# Patient Record
Sex: Female | Born: 1940 | Race: White | Hispanic: No | Marital: Married | Smoking: Former smoker
Health system: Southern US, Community
[De-identification: ages and names within clinical notes are randomized; demographics above are authoritative.]

## PROBLEM LIST (undated history)

## (undated) DIAGNOSIS — M8580 Other specified disorders of bone density and structure, unspecified site: Secondary | ICD-10-CM

## (undated) DIAGNOSIS — I10 Essential (primary) hypertension: Secondary | ICD-10-CM

## (undated) DIAGNOSIS — N952 Postmenopausal atrophic vaginitis: Secondary | ICD-10-CM

## (undated) DIAGNOSIS — E7800 Pure hypercholesterolemia, unspecified: Secondary | ICD-10-CM

## (undated) DIAGNOSIS — D219 Benign neoplasm of connective and other soft tissue, unspecified: Secondary | ICD-10-CM

## (undated) HISTORY — DX: Pure hypercholesterolemia, unspecified: E78.00

## (undated) HISTORY — DX: Benign neoplasm of connective and other soft tissue, unspecified: D21.9

## (undated) HISTORY — DX: Essential (primary) hypertension: I10

## (undated) HISTORY — PX: HYSTEROSCOPY: SHX211

## (undated) HISTORY — DX: Postmenopausal atrophic vaginitis: N95.2

## (undated) HISTORY — DX: Other specified disorders of bone density and structure, unspecified site: M85.80

## (undated) HISTORY — PX: TUBAL LIGATION: SHX77

---

## 1999-10-06 ENCOUNTER — Encounter: Admission: RE | Admit: 1999-10-06 | Discharge: 1999-10-06 | Payer: Self-pay | Admitting: Obstetrics and Gynecology

## 1999-10-06 ENCOUNTER — Encounter: Payer: Self-pay | Admitting: Obstetrics and Gynecology

## 1999-11-04 ENCOUNTER — Other Ambulatory Visit: Admission: RE | Admit: 1999-11-04 | Discharge: 1999-11-04 | Payer: Self-pay | Admitting: Obstetrics and Gynecology

## 1999-11-30 ENCOUNTER — Ambulatory Visit (HOSPITAL_COMMUNITY): Admission: RE | Admit: 1999-11-30 | Discharge: 1999-11-30 | Payer: Self-pay | Admitting: Obstetrics and Gynecology

## 1999-11-30 ENCOUNTER — Encounter (INDEPENDENT_AMBULATORY_CARE_PROVIDER_SITE_OTHER): Payer: Self-pay

## 2000-07-21 ENCOUNTER — Other Ambulatory Visit: Admission: RE | Admit: 2000-07-21 | Discharge: 2000-07-21 | Payer: Self-pay | Admitting: Obstetrics and Gynecology

## 2000-07-21 ENCOUNTER — Encounter (INDEPENDENT_AMBULATORY_CARE_PROVIDER_SITE_OTHER): Payer: Self-pay

## 2000-10-06 ENCOUNTER — Encounter: Admission: RE | Admit: 2000-10-06 | Discharge: 2000-10-06 | Payer: Self-pay | Admitting: Obstetrics and Gynecology

## 2000-10-06 ENCOUNTER — Encounter: Payer: Self-pay | Admitting: Obstetrics and Gynecology

## 2000-10-28 ENCOUNTER — Encounter: Admission: RE | Admit: 2000-10-28 | Discharge: 2000-10-28 | Payer: Self-pay | Admitting: Family Medicine

## 2000-10-28 ENCOUNTER — Encounter: Payer: Self-pay | Admitting: Family Medicine

## 2000-11-08 ENCOUNTER — Other Ambulatory Visit: Admission: RE | Admit: 2000-11-08 | Discharge: 2000-11-08 | Payer: Self-pay | Admitting: Obstetrics and Gynecology

## 2001-10-23 ENCOUNTER — Encounter: Admission: RE | Admit: 2001-10-23 | Discharge: 2001-10-23 | Payer: Self-pay | Admitting: Obstetrics and Gynecology

## 2001-10-23 ENCOUNTER — Encounter: Payer: Self-pay | Admitting: Obstetrics and Gynecology

## 2001-11-08 ENCOUNTER — Other Ambulatory Visit: Admission: RE | Admit: 2001-11-08 | Discharge: 2001-11-08 | Payer: Self-pay | Admitting: Obstetrics and Gynecology

## 2002-11-02 ENCOUNTER — Encounter: Admission: RE | Admit: 2002-11-02 | Discharge: 2002-11-02 | Payer: Self-pay | Admitting: Family Medicine

## 2002-11-02 ENCOUNTER — Encounter: Payer: Self-pay | Admitting: Family Medicine

## 2002-11-26 ENCOUNTER — Other Ambulatory Visit: Admission: RE | Admit: 2002-11-26 | Discharge: 2002-11-26 | Payer: Self-pay | Admitting: Obstetrics and Gynecology

## 2003-04-04 ENCOUNTER — Ambulatory Visit (HOSPITAL_COMMUNITY): Admission: RE | Admit: 2003-04-04 | Discharge: 2003-04-04 | Payer: Self-pay | Admitting: Gastroenterology

## 2003-11-04 ENCOUNTER — Encounter: Admission: RE | Admit: 2003-11-04 | Discharge: 2003-11-04 | Payer: Self-pay | Admitting: Family Medicine

## 2003-11-28 ENCOUNTER — Other Ambulatory Visit: Admission: RE | Admit: 2003-11-28 | Discharge: 2003-11-28 | Payer: Self-pay | Admitting: Obstetrics and Gynecology

## 2004-11-06 ENCOUNTER — Encounter: Admission: RE | Admit: 2004-11-06 | Discharge: 2004-11-06 | Payer: Self-pay | Admitting: Family Medicine

## 2004-11-30 ENCOUNTER — Other Ambulatory Visit: Admission: RE | Admit: 2004-11-30 | Discharge: 2004-11-30 | Payer: Self-pay | Admitting: Obstetrics and Gynecology

## 2005-03-10 ENCOUNTER — Encounter: Admission: RE | Admit: 2005-03-10 | Discharge: 2005-03-10 | Payer: Self-pay | Admitting: Family Medicine

## 2005-11-10 ENCOUNTER — Encounter: Admission: RE | Admit: 2005-11-10 | Discharge: 2005-11-10 | Payer: Self-pay | Admitting: Family Medicine

## 2005-12-07 ENCOUNTER — Other Ambulatory Visit: Admission: RE | Admit: 2005-12-07 | Discharge: 2005-12-07 | Payer: Self-pay | Admitting: Obstetrics and Gynecology

## 2006-11-14 ENCOUNTER — Encounter: Admission: RE | Admit: 2006-11-14 | Discharge: 2006-11-14 | Payer: Self-pay | Admitting: Obstetrics and Gynecology

## 2006-12-09 ENCOUNTER — Other Ambulatory Visit: Admission: RE | Admit: 2006-12-09 | Discharge: 2006-12-09 | Payer: Self-pay | Admitting: Obstetrics and Gynecology

## 2007-03-27 ENCOUNTER — Encounter: Admission: RE | Admit: 2007-03-27 | Discharge: 2007-03-27 | Payer: Self-pay | Admitting: Internal Medicine

## 2007-11-17 ENCOUNTER — Encounter: Admission: RE | Admit: 2007-11-17 | Discharge: 2007-11-17 | Payer: Self-pay | Admitting: Obstetrics and Gynecology

## 2007-11-28 ENCOUNTER — Encounter: Admission: RE | Admit: 2007-11-28 | Discharge: 2007-11-28 | Payer: Self-pay | Admitting: Obstetrics and Gynecology

## 2007-12-12 ENCOUNTER — Other Ambulatory Visit: Admission: RE | Admit: 2007-12-12 | Discharge: 2007-12-12 | Payer: Self-pay | Admitting: Obstetrics and Gynecology

## 2008-05-27 ENCOUNTER — Encounter: Admission: RE | Admit: 2008-05-27 | Discharge: 2008-05-27 | Payer: Self-pay | Admitting: Obstetrics and Gynecology

## 2008-11-18 ENCOUNTER — Encounter: Admission: RE | Admit: 2008-11-18 | Discharge: 2008-11-18 | Payer: Self-pay | Admitting: Obstetrics and Gynecology

## 2008-12-12 ENCOUNTER — Ambulatory Visit: Payer: Self-pay | Admitting: Obstetrics and Gynecology

## 2008-12-18 ENCOUNTER — Ambulatory Visit: Payer: Self-pay | Admitting: Obstetrics and Gynecology

## 2009-07-04 ENCOUNTER — Ambulatory Visit (HOSPITAL_COMMUNITY): Admission: RE | Admit: 2009-07-04 | Discharge: 2009-07-04 | Payer: Self-pay | Admitting: Internal Medicine

## 2009-10-04 DIAGNOSIS — M8580 Other specified disorders of bone density and structure, unspecified site: Secondary | ICD-10-CM

## 2009-10-04 HISTORY — DX: Other specified disorders of bone density and structure, unspecified site: M85.80

## 2009-11-19 ENCOUNTER — Encounter: Admission: RE | Admit: 2009-11-19 | Discharge: 2009-11-19 | Payer: Self-pay | Admitting: Obstetrics and Gynecology

## 2009-12-16 ENCOUNTER — Ambulatory Visit: Payer: Self-pay | Admitting: Obstetrics and Gynecology

## 2010-10-24 ENCOUNTER — Other Ambulatory Visit: Payer: Self-pay | Admitting: Internal Medicine

## 2010-10-24 DIAGNOSIS — Z1239 Encounter for other screening for malignant neoplasm of breast: Secondary | ICD-10-CM

## 2010-10-25 ENCOUNTER — Encounter: Payer: Self-pay | Admitting: Family Medicine

## 2010-11-30 ENCOUNTER — Ambulatory Visit
Admission: RE | Admit: 2010-11-30 | Discharge: 2010-11-30 | Disposition: A | Discharge status: Home / Self Care | Payer: BC Managed Care – PPO | Source: Ambulatory Visit | Attending: Internal Medicine | Admitting: Internal Medicine

## 2010-11-30 DIAGNOSIS — Z1239 Encounter for other screening for malignant neoplasm of breast: Secondary | ICD-10-CM

## 2010-12-22 ENCOUNTER — Encounter (INDEPENDENT_AMBULATORY_CARE_PROVIDER_SITE_OTHER): Payer: BC Managed Care – PPO | Admitting: Obstetrics and Gynecology

## 2010-12-22 DIAGNOSIS — R82998 Other abnormal findings in urine: Secondary | ICD-10-CM

## 2010-12-22 DIAGNOSIS — Z01419 Encounter for gynecological examination (general) (routine) without abnormal findings: Secondary | ICD-10-CM

## 2011-02-19 NOTE — Op Note (Signed)
   [name], [name]                         ACCOUNT NO.:  [id number]   MEDICAL RECORD NO.:  [id number]                   PATIENT TYPE:  AMB   LOCATION:  ENDO                                 FACILITY:  [facility]   PHYSICIAN:  Petra Kuba, M.D.                 DATE OF BIRTH:  [birth year]   DATE OF PROCEDURE:  04/04/2003  DATE OF DISCHARGE:                                 OPERATIVE REPORT   PROCEDURE:  Colonoscopy.   INDICATIONS:  Screening.  Consent was signed after risks, benefits, methods,  and options thoroughly discussed in the office.   MEDICATIONS:  Demerol 50 mg, Versed 4 mg.   PROCEDURE:  Rectal inspection was pertinent for external hemorrhoids.  Digital exam was negative.  The Olympus pediatric adjustable colonoscope was  inserted and with moderate difficulty due to a very tortuous, long, looping  colon with various abdominal pressures and rolling her on her back, we were  able to advance to the cecum.  No abnormality was seen on insertion.  The  cecum was identified by the appendiceal orifice and the ileocecal valve; in  fact, the scope was inserted a short way into the terminal ileum, which was  normal.  Photo documentation was obtained.  Prep was adequate.  There was  some liquid stool that required washing and suctioning.  On slow withdrawal  through the colon, no abnormalities were seen, specifically no polyps, tumor  masses, diverticula, or other abnormalities.  When we fell back behind a  particular tortuous loop, we tried to readvance around the curves to  decrease chances of missing anything.  Once back in the rectum anorectal  pull-through and retroflexion confirmed some small hemorrhoids.  The scope  was straightened and readvanced a short way up the ft side of the colon, the  air was suctioned and scope removed.  The patient tolerated the procedure  well.  There was no obvious immediate complication.   ENDOSCOPIC DIAGNOSES:  1. Small internal-external  hemorrhoids.  2. Otherwise within normal limits to the terminal ileum except tortuosity.   PLAN:  Yearly rectals and guaiacs per Dr. Modesto Charon or gynecology.  Happy to see  back p.r.n., otherwise repeat screening in five to 10 years if doing well  medically.                                               Petra Kuba, M.D.    MEM/MEDQ  D:  04/04/2003  T:  04/04/2003  Job:  [id number]   cc:   Thelma Barge P. Modesto Charon, M.D.  [address]  [place]  [place] [address]  Fax: [phone]

## 2011-11-01 ENCOUNTER — Other Ambulatory Visit: Payer: Self-pay | Admitting: Internal Medicine

## 2011-11-01 DIAGNOSIS — Z1231 Encounter for screening mammogram for malignant neoplasm of breast: Secondary | ICD-10-CM

## 2011-12-02 ENCOUNTER — Ambulatory Visit
Admission: RE | Admit: 2011-12-02 | Discharge: 2011-12-02 | Disposition: A | Discharge status: Home / Self Care | Payer: BC Managed Care – PPO | Source: Ambulatory Visit | Attending: Internal Medicine | Admitting: Internal Medicine

## 2011-12-02 DIAGNOSIS — Z1231 Encounter for screening mammogram for malignant neoplasm of breast: Secondary | ICD-10-CM

## 2011-12-24 ENCOUNTER — Encounter: Payer: Self-pay | Admitting: Gynecology

## 2011-12-24 DIAGNOSIS — N952 Postmenopausal atrophic vaginitis: Secondary | ICD-10-CM | POA: Insufficient documentation

## 2011-12-24 DIAGNOSIS — I10 Essential (primary) hypertension: Secondary | ICD-10-CM | POA: Insufficient documentation

## 2011-12-24 DIAGNOSIS — D219 Benign neoplasm of connective and other soft tissue, unspecified: Secondary | ICD-10-CM | POA: Insufficient documentation

## 2011-12-24 DIAGNOSIS — M8580 Other specified disorders of bone density and structure, unspecified site: Secondary | ICD-10-CM | POA: Insufficient documentation

## 2011-12-24 DIAGNOSIS — E7800 Pure hypercholesterolemia, unspecified: Secondary | ICD-10-CM | POA: Insufficient documentation

## 2011-12-30 ENCOUNTER — Encounter: Payer: Self-pay | Admitting: Obstetrics and Gynecology

## 2011-12-30 ENCOUNTER — Ambulatory Visit: Payer: BC Managed Care – PPO | Admitting: Obstetrics and Gynecology

## 2011-12-30 VITALS — BP 140/86 | Ht 60.5 in | Wt 110.0 lb

## 2011-12-30 DIAGNOSIS — Z01419 Encounter for gynecological examination (general) (routine) without abnormal findings: Secondary | ICD-10-CM

## 2011-12-30 LAB — URINALYSIS W MICROSCOPIC + REFLEX CULTURE
Glucose, UA: NEGATIVE mg/dL
Hgb urine dipstick: NEGATIVE
Ketones, ur: NEGATIVE mg/dL
Leukocytes, UA: NEGATIVE
Specific Gravity, Urine: 1.005 (ref 1.005–1.030)
Squamous Epithelial / LPF: NONE SEEN
pH: 6.5 (ref 5.0–8.0)

## 2011-12-30 NOTE — Progress Notes (Signed)
Patient came to see me today for her annual GYN exam. She is having no vaginal bleeding. She is having no pelvic pain. She is not having any hot flashes. She does have vaginal dryness but is not currently sexually active due to her husband. She previously been on Actonel for osteopenia but has been on drug holiday. Her last bone density showed her worst T score to be -1.2. She did have some additional bone loss from  the previous one. She is up-to-date on mammograms. She has nocturia x1. She has no urgency, dysuria, urinary frequency, or incontinence.  Physical examination: Kennon Portela present HEENT within normal limits. Neck: Thyroid not large. No masses. Supraclavicular nodes: not enlarged. Breasts: Examined in both sitting and lying  position. No skin changes and no masses. Abdomen: Soft no guarding rebound or masses or hernia. Pelvic: External: Within normal limits. BUS: Within normal limits. Vaginal:within normal limits. Poor  estrogen effect. No evidence of cystocele rectocele or enterocele. Cervix: clean. Uterus: Normal size and shape. Adnexa: No masses. Rectovaginal exam: Confirmatory and negative. Extremities: Within normal limits.  Assessment: Atrophic vaginitis. Osteopenia. Nocturia.  Plan: Continue yearly mammograms. Bone density next year with mammogram. Lab through PCP.

## 2012-10-25 ENCOUNTER — Other Ambulatory Visit: Payer: Self-pay | Admitting: Internal Medicine

## 2012-10-25 DIAGNOSIS — Z1231 Encounter for screening mammogram for malignant neoplasm of breast: Secondary | ICD-10-CM

## 2012-11-18 ENCOUNTER — Ambulatory Visit: Payer: BC Managed Care – PPO

## 2012-11-18 ENCOUNTER — Ambulatory Visit (INDEPENDENT_AMBULATORY_CARE_PROVIDER_SITE_OTHER): Payer: BC Managed Care – PPO | Admitting: Internal Medicine

## 2012-11-18 VITALS — BP 147/78 | HR 67 | Temp 98.0°F | Resp 18 | Wt 106.0 lb

## 2012-11-18 DIAGNOSIS — R109 Unspecified abdominal pain: Secondary | ICD-10-CM

## 2012-11-18 DIAGNOSIS — R1032 Left lower quadrant pain: Secondary | ICD-10-CM

## 2012-11-18 LAB — POCT URINALYSIS DIPSTICK
Blood, UA: NEGATIVE
Glucose, UA: NEGATIVE
Ketones, UA: NEGATIVE
Protein, UA: NEGATIVE
pH, UA: 7

## 2012-11-18 LAB — POCT UA - MICROSCOPIC ONLY
Crystals, Ur, HPF, POC: NEGATIVE
Mucus, UA: NEGATIVE

## 2012-11-18 LAB — POCT CBC
Granulocyte percent: 65.4 %G (ref 37–80)
Hemoglobin: 15.3 g/dL (ref 12.2–16.2)
MCV: 100.4 fL — AB (ref 80–97)
MID (cbc): 0.4 (ref 0–0.9)
MPV: 8.4 fL (ref 0–99.8)
POC Granulocyte: 4.3 (ref 2–6.9)
POC LYMPH PERCENT: 29.2 %L (ref 10–50)
POC MID %: 5.4 %M (ref 0–12)
RBC: 4.59 M/uL (ref 4.04–5.48)
WBC: 6.5 10*3/uL (ref 4.6–10.2)

## 2012-11-18 LAB — COMPREHENSIVE METABOLIC PANEL
ALT: 13 U/L (ref 0–35)
AST: 21 U/L (ref 0–37)
Albumin: 4.8 g/dL (ref 3.5–5.2)
CO2: 28 mEq/L (ref 19–32)
Calcium: 9.9 mg/dL (ref 8.4–10.5)
Chloride: 97 mEq/L (ref 96–112)
Creat: 0.83 mg/dL (ref 0.50–1.10)
Potassium: 4.1 mEq/L (ref 3.5–5.3)
Sodium: 133 mEq/L — ABNORMAL LOW (ref 135–145)

## 2012-11-18 MED ORDER — KETOROLAC TROMETHAMINE 60 MG/2ML IM SOLN
60.0000 mg | Freq: Once | INTRAMUSCULAR | Status: AC
  Administered 2012-11-18: 60 mg via INTRAMUSCULAR

## 2012-11-18 NOTE — Progress Notes (Signed)
  Subjective:    Patient ID: [name], female    DOB: 09/03/1941, 72 y.o.   MRN: [id number]  HPI complaining of four-day history of pain in the left flank extending toward the left lower quadrant The pain is colicky in nature and comes and goes No associated constipation or diarrhea No dysuria frequency urgency or hematuria History of a similar episode 4-5 years ago that resolved with an IM injection/suspected stone No fever chills night sweats No nausea vomiting  Primary care Dr. Kevan Ny On medication for hypertension and hyperlipidemia    Review of Systems No recent activity changes or fatigue No fever chills or night sweats No recent weight loss No chest pain or palpitations No reflux or dyspepsia    Objective:   Physical Exam Blood pressure 147/78 No acute distress Heart regular without murmur Lungs clear Abdomen supple without organomegaly masses or tenderness Tender palpation in the left flank and the left midaxillary line close to the left lower quadrant Negative percussion tenderness/ no rebound tenderness     [facility] reading (PRIMARY) by  Dr. Josephina Gip stone seen Lots of stool LLQ,LUQ   Results for orders placed in visit on 11/18/12  POCT CBC      Result Value Range   WBC 6.5  4.6 - 10.2 K/uL   Lymph, poc 1.9  0.6 - 3.4   POC LYMPH PERCENT 29.2  10 - 50 %L   MID (cbc) 0.4  0 - 0.9   POC MID % 5.4  0 - 12 %M   POC Granulocyte 4.3  2 - 6.9   Granulocyte percent 65.4  37 - 80 %G   RBC 4.59  4.04 - 5.48 M/uL   Hemoglobin 15.3  12.2 - 16.2 g/dL   HCT, POC 18.8  41.6 - 47.9 %   MCV 100.4 (*) 80 - 97 fL   MCH, POC 33.3 (*) 27 - 31.2 pg   MCHC 33.2  31.8 - 35.4 g/dL   RDW, POC 60.6     Platelet Count, POC 290  142 - 424 K/uL   MPV 8.4  0 - 99.8 fL  POCT UA - MICROSCOPIC ONLY      Result Value Range   WBC, Ur, HPF, POC 1-5     RBC, urine, microscopic 0-1     Bacteria, U Microscopic trace     Mucus, UA neg     Epithelial cells, urine per micros 0-1      Crystals, Ur, HPF, POC neg     Casts, Ur, LPF, POC neg     Yeast, UA neg    POCT URINALYSIS DIPSTICK      Result Value Range   Color, UA yellow     Clarity, UA clear     Glucose, UA neg     Bilirubin, UA neg     Ketones, UA neg     Spec Grav, UA 1.010     Blood, UA neg     pH, UA 7.0     Protein, UA neg     Urobilinogen, UA 0.2     Nitrite, UA neg     Leukocytes, UA Trace      Assessment & Plan:  Problem #1 left flank pain/left lower quadrant pain  Nephrolithiasis versus constipation Toradol 60 a.m. Start MiraLax 17 g daily Recheck 48-72 hours with abdominal ultrasound if not improving Check metabolic profile

## 2012-12-12 ENCOUNTER — Ambulatory Visit
Admission: RE | Admit: 2012-12-12 | Discharge: 2012-12-12 | Disposition: A | Discharge status: Home / Self Care | Payer: BC Managed Care – PPO | Source: Ambulatory Visit | Attending: Internal Medicine | Admitting: Internal Medicine

## 2012-12-12 DIAGNOSIS — Z1231 Encounter for screening mammogram for malignant neoplasm of breast: Secondary | ICD-10-CM

## 2013-01-01 ENCOUNTER — Encounter: Payer: Self-pay | Admitting: Gynecology

## 2013-01-01 ENCOUNTER — Ambulatory Visit (INDEPENDENT_AMBULATORY_CARE_PROVIDER_SITE_OTHER): Payer: BC Managed Care – PPO | Admitting: Gynecology

## 2013-01-01 VITALS — BP 120/74 | Ht 60.0 in | Wt 103.0 lb

## 2013-01-01 DIAGNOSIS — M8580 Other specified disorders of bone density and structure, unspecified site: Secondary | ICD-10-CM

## 2013-01-01 DIAGNOSIS — N952 Postmenopausal atrophic vaginitis: Secondary | ICD-10-CM

## 2013-01-01 DIAGNOSIS — Z01419 Encounter for gynecological examination (general) (routine) without abnormal findings: Secondary | ICD-10-CM

## 2013-01-01 DIAGNOSIS — N9089 Other specified noninflammatory disorders of vulva and perineum: Secondary | ICD-10-CM

## 2013-01-01 DIAGNOSIS — M899 Disorder of bone, unspecified: Secondary | ICD-10-CM

## 2013-01-01 DIAGNOSIS — M949 Disorder of cartilage, unspecified: Secondary | ICD-10-CM

## 2013-01-01 NOTE — Patient Instructions (Signed)
Call me if you do any further bleeding. We will start with ultrasound. Otherwise followup in one year for annual exam. Schedule DEXA bone density when you do your next mammogram.

## 2013-01-01 NOTE — Progress Notes (Signed)
AMELIYA NICOTRA 09-09-1941 [id number]        72 y.o.  [id number] for followup exam.  Former patient of Dr. Eda Paschal Several issues noted below.  Past medical history,surgical history, medications, allergies, family history and social history were all reviewed and documented in the EPIC chart. ROS:  Was performed and pertinent positives and negatives are included in the history.  Exam: Kim assistant Filed Vitals:   01/01/13 1545  BP: 120/74  Height: 5' (1.524 m)  Weight: 103 lb (46.72 kg)   General appearance  Normal Skin grossly normal Head/Neck normal with no cervical or supraclavicular adenopathy thyroid normal Lungs  clear Cardiac RR, without RMG Abdominal  soft, nontender, without masses, organomegaly or hernia Breasts  examined lying and sitting without masses, retractions, discharge or axillary adenopathy. Pelvic  Ext/BUS/vagina  normal with atrophic changes  Cervix  normal with atrophic changes  Uterus  anteverted, small size, shape and contour, midline and mobile nontender   Adnexa  Without masses or tenderness    Anus and perineum  normal   Rectovaginal  normal sphincter tone without palpated masses or tenderness.    Assessment/Plan:  72 y.o. [id number] female for followup exam.   1. Episode of perineal bleeding. Patient was evaluated at urgent care secondary to left lower quadrant pain thought to be possibly a kidney stone in February. She subsequent had an episode of bleeding on her tissue paper where she followed up with her primary physician and ultimately had a colonoscopy which was normal. On questioning she was unclear whether it was truly rectal versus vaginal. I reviewed with her the issues that if indeed vaginal could represent uterine cancer, recognizing atrophic bleeding both vaginal/uterine more likely. Options for management reviewed to include expectant management as this was a single episode of unclear etiology versus starting with ultrasound now for endometrial  echo. The various possibilities to include thin endometrium, indeterminate measurement or thickened endometrium was reviewed and the possibilities for followup to include endometrial sampling. My preference would be to start with the ultrasound but the patient is reluctant and prefers just to observe. She agrees to report any subsequent bleeding for further evaluation and she clearly understands the various possibilities to include endometrial cancer. 2. Osteopenia. DEXA 11/2009 with T score -1.2 FRAX 7.6%/0.8%. Recommend repeat when she has her next mammogram. She had been on Actonel for several years previously but has been off of this for several years. She is due for her mammogram next year and I think it is fine to wait until then to repeat her bone density. Increase calcium and vitamin D reviewed 3. Mammography 12/2012 normal. Repeat next year. SBE monthly reviewed. 4. Pap smear 2009. No Pap smear done today. No history of abnormal Pap smears. Review current screening guidelines as she is over the age of 30 he does agree to stop screening. 5. Colonoscopy 2014. Repeat at 10 year interval per their recommendation. 6. Lab work. No lab work done as it is all done through her primary physician's office. Followup as noted above otherwise in one year.   Dara Lords MD, 4:17 PM 01/01/2013

## 2013-05-09 ENCOUNTER — Other Ambulatory Visit: Payer: Self-pay

## 2013-08-09 ENCOUNTER — Other Ambulatory Visit: Payer: Self-pay

## 2013-11-28 ENCOUNTER — Other Ambulatory Visit: Payer: Self-pay

## 2013-11-28 DIAGNOSIS — Z1231 Encounter for screening mammogram for malignant neoplasm of breast: Secondary | ICD-10-CM

## 2013-12-17 ENCOUNTER — Ambulatory Visit
Admission: RE | Admit: 2013-12-17 | Discharge: 2013-12-17 | Disposition: A | Discharge status: Home / Self Care | Payer: BC Managed Care – PPO | Source: Ambulatory Visit

## 2013-12-17 DIAGNOSIS — Z1231 Encounter for screening mammogram for malignant neoplasm of breast: Secondary | ICD-10-CM

## 2014-03-15 ENCOUNTER — Other Ambulatory Visit: Payer: Self-pay | Admitting: Obstetrics and Gynecology

## 2014-03-15 DIAGNOSIS — M8580 Other specified disorders of bone density and structure, unspecified site: Secondary | ICD-10-CM

## 2014-04-16 ENCOUNTER — Other Ambulatory Visit: Payer: BC Managed Care – PPO

## 2014-04-23 ENCOUNTER — Ambulatory Visit
Admission: RE | Admit: 2014-04-23 | Discharge: 2014-04-23 | Disposition: A | Discharge status: Home / Self Care | Payer: BC Managed Care – PPO | Source: Ambulatory Visit | Attending: Obstetrics and Gynecology | Admitting: Obstetrics and Gynecology

## 2014-04-23 DIAGNOSIS — M8580 Other specified disorders of bone density and structure, unspecified site: Secondary | ICD-10-CM

## 2014-08-05 ENCOUNTER — Encounter: Payer: Self-pay | Admitting: Gynecology

## 2014-11-21 ENCOUNTER — Other Ambulatory Visit: Payer: Self-pay

## 2014-11-21 DIAGNOSIS — Z1231 Encounter for screening mammogram for malignant neoplasm of breast: Secondary | ICD-10-CM

## 2014-12-20 ENCOUNTER — Ambulatory Visit
Admission: RE | Admit: 2014-12-20 | Discharge: 2014-12-20 | Disposition: A | Discharge status: Home / Self Care | Payer: BLUE CROSS/BLUE SHIELD | Source: Ambulatory Visit

## 2014-12-20 DIAGNOSIS — Z1231 Encounter for screening mammogram for malignant neoplasm of breast: Secondary | ICD-10-CM

## 2015-11-13 ENCOUNTER — Other Ambulatory Visit: Payer: Self-pay

## 2015-11-13 DIAGNOSIS — Z1231 Encounter for screening mammogram for malignant neoplasm of breast: Secondary | ICD-10-CM

## 2015-12-22 ENCOUNTER — Ambulatory Visit
Admission: RE | Admit: 2015-12-22 | Discharge: 2015-12-22 | Disposition: A | Discharge status: Home / Self Care | Payer: BLUE CROSS/BLUE SHIELD | Source: Ambulatory Visit

## 2015-12-22 DIAGNOSIS — Z1231 Encounter for screening mammogram for malignant neoplasm of breast: Secondary | ICD-10-CM

## 2016-11-17 ENCOUNTER — Other Ambulatory Visit: Payer: Self-pay | Admitting: Obstetrics and Gynecology

## 2016-11-17 DIAGNOSIS — Z1231 Encounter for screening mammogram for malignant neoplasm of breast: Secondary | ICD-10-CM

## 2016-12-27 ENCOUNTER — Ambulatory Visit
Admission: RE | Admit: 2016-12-27 | Discharge: 2016-12-27 | Disposition: A | Discharge status: Home / Self Care | Payer: BLUE CROSS/BLUE SHIELD | Source: Ambulatory Visit | Attending: Obstetrics and Gynecology | Admitting: Obstetrics and Gynecology

## 2016-12-27 DIAGNOSIS — Z1231 Encounter for screening mammogram for malignant neoplasm of breast: Secondary | ICD-10-CM

## 2017-11-17 ENCOUNTER — Other Ambulatory Visit: Payer: Self-pay | Admitting: Obstetrics and Gynecology

## 2017-11-17 DIAGNOSIS — Z1231 Encounter for screening mammogram for malignant neoplasm of breast: Secondary | ICD-10-CM

## 2018-01-02 ENCOUNTER — Ambulatory Visit: Payer: BLUE CROSS/BLUE SHIELD

## 2018-01-09 ENCOUNTER — Ambulatory Visit
Admission: RE | Admit: 2018-01-09 | Discharge: 2018-01-09 | Disposition: A | Discharge status: Home / Self Care | Payer: BLUE CROSS/BLUE SHIELD | Source: Ambulatory Visit | Attending: Obstetrics and Gynecology | Admitting: Obstetrics and Gynecology

## 2018-01-09 DIAGNOSIS — Z1231 Encounter for screening mammogram for malignant neoplasm of breast: Secondary | ICD-10-CM

## 2018-10-03 DIAGNOSIS — E559 Vitamin D deficiency, unspecified: Secondary | ICD-10-CM | POA: Diagnosis not present

## 2018-10-03 DIAGNOSIS — R946 Abnormal results of thyroid function studies: Secondary | ICD-10-CM | POA: Diagnosis not present

## 2018-10-03 DIAGNOSIS — Z0000 Encounter for general adult medical examination without abnormal findings: Secondary | ICD-10-CM | POA: Diagnosis not present

## 2018-10-03 DIAGNOSIS — Z79899 Other long term (current) drug therapy: Secondary | ICD-10-CM | POA: Diagnosis not present

## 2018-10-03 DIAGNOSIS — I10 Essential (primary) hypertension: Secondary | ICD-10-CM | POA: Diagnosis not present

## 2018-10-03 DIAGNOSIS — E871 Hypo-osmolality and hyponatremia: Secondary | ICD-10-CM | POA: Diagnosis not present

## 2018-10-03 DIAGNOSIS — E878 Other disorders of electrolyte and fluid balance, not elsewhere classified: Secondary | ICD-10-CM | POA: Diagnosis not present

## 2018-10-03 DIAGNOSIS — B351 Tinea unguium: Secondary | ICD-10-CM | POA: Diagnosis not present

## 2018-10-03 DIAGNOSIS — R0989 Other specified symptoms and signs involving the circulatory and respiratory systems: Secondary | ICD-10-CM | POA: Diagnosis not present

## 2018-10-03 DIAGNOSIS — E782 Mixed hyperlipidemia: Secondary | ICD-10-CM | POA: Diagnosis not present

## 2018-10-16 DIAGNOSIS — R9431 Abnormal electrocardiogram [ECG] [EKG]: Secondary | ICD-10-CM | POA: Diagnosis not present

## 2018-10-16 DIAGNOSIS — R0989 Other specified symptoms and signs involving the circulatory and respiratory systems: Secondary | ICD-10-CM | POA: Diagnosis not present

## 2018-10-27 DIAGNOSIS — R0989 Other specified symptoms and signs involving the circulatory and respiratory systems: Secondary | ICD-10-CM | POA: Diagnosis not present

## 2018-10-27 DIAGNOSIS — E782 Mixed hyperlipidemia: Secondary | ICD-10-CM | POA: Diagnosis not present

## 2019-03-07 ENCOUNTER — Other Ambulatory Visit: Payer: Self-pay | Admitting: Internal Medicine

## 2019-03-07 DIAGNOSIS — Z1231 Encounter for screening mammogram for malignant neoplasm of breast: Secondary | ICD-10-CM

## 2019-04-24 ENCOUNTER — Ambulatory Visit
Admission: RE | Admit: 2019-04-24 | Discharge: 2019-04-24 | Disposition: A | Discharge status: Home / Self Care | Payer: Medicare HMO | Source: Ambulatory Visit | Attending: Internal Medicine | Admitting: Internal Medicine

## 2019-04-24 ENCOUNTER — Other Ambulatory Visit: Payer: Self-pay

## 2019-04-24 DIAGNOSIS — Z1231 Encounter for screening mammogram for malignant neoplasm of breast: Secondary | ICD-10-CM | POA: Diagnosis not present

## 2019-06-09 DIAGNOSIS — R69 Illness, unspecified: Secondary | ICD-10-CM | POA: Diagnosis not present

## 2019-07-31 DIAGNOSIS — H40013 Open angle with borderline findings, low risk, bilateral: Secondary | ICD-10-CM | POA: Diagnosis not present

## 2019-07-31 DIAGNOSIS — H25813 Combined forms of age-related cataract, bilateral: Secondary | ICD-10-CM | POA: Diagnosis not present

## 2019-09-10 DIAGNOSIS — Z01419 Encounter for gynecological examination (general) (routine) without abnormal findings: Secondary | ICD-10-CM | POA: Diagnosis not present

## 2019-09-10 DIAGNOSIS — Z6820 Body mass index (BMI) 20.0-20.9, adult: Secondary | ICD-10-CM | POA: Diagnosis not present

## 2019-10-15 DIAGNOSIS — R69 Illness, unspecified: Secondary | ICD-10-CM | POA: Diagnosis not present

## 2019-10-19 DIAGNOSIS — I10 Essential (primary) hypertension: Secondary | ICD-10-CM | POA: Diagnosis not present

## 2019-10-19 DIAGNOSIS — Z1389 Encounter for screening for other disorder: Secondary | ICD-10-CM | POA: Diagnosis not present

## 2019-10-19 DIAGNOSIS — B351 Tinea unguium: Secondary | ICD-10-CM | POA: Diagnosis not present

## 2019-10-19 DIAGNOSIS — R946 Abnormal results of thyroid function studies: Secondary | ICD-10-CM | POA: Diagnosis not present

## 2019-10-19 DIAGNOSIS — E871 Hypo-osmolality and hyponatremia: Secondary | ICD-10-CM | POA: Diagnosis not present

## 2019-10-19 DIAGNOSIS — Z0001 Encounter for general adult medical examination with abnormal findings: Secondary | ICD-10-CM | POA: Diagnosis not present

## 2019-10-19 DIAGNOSIS — E878 Other disorders of electrolyte and fluid balance, not elsewhere classified: Secondary | ICD-10-CM | POA: Diagnosis not present

## 2019-10-19 DIAGNOSIS — E782 Mixed hyperlipidemia: Secondary | ICD-10-CM | POA: Diagnosis not present

## 2019-10-19 DIAGNOSIS — R0989 Other specified symptoms and signs involving the circulatory and respiratory systems: Secondary | ICD-10-CM | POA: Diagnosis not present

## 2019-10-19 DIAGNOSIS — E559 Vitamin D deficiency, unspecified: Secondary | ICD-10-CM | POA: Diagnosis not present

## 2020-01-23 DIAGNOSIS — H25813 Combined forms of age-related cataract, bilateral: Secondary | ICD-10-CM | POA: Diagnosis not present

## 2020-01-23 DIAGNOSIS — H40013 Open angle with borderline findings, low risk, bilateral: Secondary | ICD-10-CM | POA: Diagnosis not present

## 2020-02-07 DIAGNOSIS — R69 Illness, unspecified: Secondary | ICD-10-CM | POA: Diagnosis not present

## 2020-03-20 DIAGNOSIS — H2511 Age-related nuclear cataract, right eye: Secondary | ICD-10-CM | POA: Diagnosis not present

## 2020-04-20 DIAGNOSIS — H2512 Age-related nuclear cataract, left eye: Secondary | ICD-10-CM | POA: Diagnosis not present

## 2020-04-24 DIAGNOSIS — H2512 Age-related nuclear cataract, left eye: Secondary | ICD-10-CM | POA: Diagnosis not present

## 2020-05-12 DIAGNOSIS — R69 Illness, unspecified: Secondary | ICD-10-CM | POA: Diagnosis not present

## 2020-07-04 DIAGNOSIS — M8588 Other specified disorders of bone density and structure, other site: Secondary | ICD-10-CM | POA: Diagnosis not present

## 2020-07-04 DIAGNOSIS — N958 Other specified menopausal and perimenopausal disorders: Secondary | ICD-10-CM | POA: Diagnosis not present

## 2020-07-04 DIAGNOSIS — Z1231 Encounter for screening mammogram for malignant neoplasm of breast: Secondary | ICD-10-CM | POA: Diagnosis not present

## 2020-07-07 DIAGNOSIS — R69 Illness, unspecified: Secondary | ICD-10-CM | POA: Diagnosis not present

## 2020-07-09 ENCOUNTER — Other Ambulatory Visit: Payer: Self-pay | Admitting: Obstetrics & Gynecology

## 2020-07-09 DIAGNOSIS — R928 Other abnormal and inconclusive findings on diagnostic imaging of breast: Secondary | ICD-10-CM

## 2020-07-17 ENCOUNTER — Ambulatory Visit: Payer: Medicare HMO

## 2020-07-17 ENCOUNTER — Other Ambulatory Visit: Payer: Self-pay

## 2020-07-17 ENCOUNTER — Ambulatory Visit
Admission: RE | Admit: 2020-07-17 | Discharge: 2020-07-17 | Disposition: A | Discharge status: Home / Self Care | Payer: Medicare HMO | Source: Ambulatory Visit | Attending: Obstetrics & Gynecology | Admitting: Obstetrics & Gynecology

## 2020-07-17 DIAGNOSIS — R928 Other abnormal and inconclusive findings on diagnostic imaging of breast: Secondary | ICD-10-CM

## 2020-07-17 DIAGNOSIS — R922 Inconclusive mammogram: Secondary | ICD-10-CM | POA: Diagnosis not present

## 2020-07-24 DIAGNOSIS — Z961 Presence of intraocular lens: Secondary | ICD-10-CM | POA: Diagnosis not present

## 2020-09-09 DIAGNOSIS — Z961 Presence of intraocular lens: Secondary | ICD-10-CM | POA: Diagnosis not present

## 2020-09-09 DIAGNOSIS — H40013 Open angle with borderline findings, low risk, bilateral: Secondary | ICD-10-CM | POA: Diagnosis not present

## 2020-09-10 DIAGNOSIS — Z124 Encounter for screening for malignant neoplasm of cervix: Secondary | ICD-10-CM | POA: Diagnosis not present

## 2020-09-10 DIAGNOSIS — Z6820 Body mass index (BMI) 20.0-20.9, adult: Secondary | ICD-10-CM | POA: Diagnosis not present

## 2020-11-24 DIAGNOSIS — R0989 Other specified symptoms and signs involving the circulatory and respiratory systems: Secondary | ICD-10-CM | POA: Diagnosis not present

## 2020-11-24 DIAGNOSIS — E559 Vitamin D deficiency, unspecified: Secondary | ICD-10-CM | POA: Diagnosis not present

## 2020-11-24 DIAGNOSIS — Z79899 Other long term (current) drug therapy: Secondary | ICD-10-CM | POA: Diagnosis not present

## 2020-11-24 DIAGNOSIS — Z0001 Encounter for general adult medical examination with abnormal findings: Secondary | ICD-10-CM | POA: Diagnosis not present

## 2020-11-24 DIAGNOSIS — I10 Essential (primary) hypertension: Secondary | ICD-10-CM | POA: Diagnosis not present

## 2020-11-24 DIAGNOSIS — Z1239 Encounter for other screening for malignant neoplasm of breast: Secondary | ICD-10-CM | POA: Diagnosis not present

## 2020-11-24 DIAGNOSIS — M62830 Muscle spasm of back: Secondary | ICD-10-CM | POA: Diagnosis not present

## 2020-11-24 DIAGNOSIS — E871 Hypo-osmolality and hyponatremia: Secondary | ICD-10-CM | POA: Diagnosis not present

## 2020-11-24 DIAGNOSIS — B351 Tinea unguium: Secondary | ICD-10-CM | POA: Diagnosis not present

## 2020-11-24 DIAGNOSIS — E878 Other disorders of electrolyte and fluid balance, not elsewhere classified: Secondary | ICD-10-CM | POA: Diagnosis not present

## 2020-11-24 DIAGNOSIS — R9431 Abnormal electrocardiogram [ECG] [EKG]: Secondary | ICD-10-CM | POA: Diagnosis not present

## 2020-11-24 DIAGNOSIS — R946 Abnormal results of thyroid function studies: Secondary | ICD-10-CM | POA: Diagnosis not present

## 2021-04-08 DIAGNOSIS — L259 Unspecified contact dermatitis, unspecified cause: Secondary | ICD-10-CM | POA: Diagnosis not present

## 2021-04-08 DIAGNOSIS — R0989 Other specified symptoms and signs involving the circulatory and respiratory systems: Secondary | ICD-10-CM | POA: Diagnosis not present

## 2021-06-01 ENCOUNTER — Other Ambulatory Visit: Payer: Self-pay | Admitting: Obstetrics & Gynecology

## 2021-06-01 DIAGNOSIS — Z1231 Encounter for screening mammogram for malignant neoplasm of breast: Secondary | ICD-10-CM

## 2021-07-02 DIAGNOSIS — B351 Tinea unguium: Secondary | ICD-10-CM | POA: Diagnosis not present

## 2021-07-02 DIAGNOSIS — L245 Irritant contact dermatitis due to other chemical products: Secondary | ICD-10-CM | POA: Diagnosis not present

## 2021-07-21 ENCOUNTER — Ambulatory Visit
Admission: RE | Admit: 2021-07-21 | Discharge: 2021-07-21 | Disposition: A | Discharge status: Home / Self Care | Payer: Medicare HMO | Source: Ambulatory Visit | Attending: Obstetrics & Gynecology | Admitting: Obstetrics & Gynecology

## 2021-07-21 ENCOUNTER — Other Ambulatory Visit: Payer: Self-pay

## 2021-07-21 DIAGNOSIS — Z1231 Encounter for screening mammogram for malignant neoplasm of breast: Secondary | ICD-10-CM

## 2021-07-27 ENCOUNTER — Other Ambulatory Visit: Payer: Self-pay | Admitting: Obstetrics & Gynecology

## 2021-07-27 DIAGNOSIS — R928 Other abnormal and inconclusive findings on diagnostic imaging of breast: Secondary | ICD-10-CM

## 2021-07-30 ENCOUNTER — Ambulatory Visit
Admission: RE | Admit: 2021-07-30 | Discharge: 2021-07-30 | Disposition: A | Discharge status: Home / Self Care | Payer: Medicare HMO | Source: Ambulatory Visit | Attending: Obstetrics & Gynecology | Admitting: Obstetrics & Gynecology

## 2021-07-30 ENCOUNTER — Other Ambulatory Visit: Payer: Self-pay

## 2021-07-30 ENCOUNTER — Ambulatory Visit: Payer: Medicare HMO

## 2021-07-30 DIAGNOSIS — R928 Other abnormal and inconclusive findings on diagnostic imaging of breast: Secondary | ICD-10-CM

## 2021-07-30 DIAGNOSIS — R922 Inconclusive mammogram: Secondary | ICD-10-CM | POA: Diagnosis not present

## 2021-08-21 DIAGNOSIS — M545 Low back pain, unspecified: Secondary | ICD-10-CM | POA: Diagnosis not present

## 2021-09-30 DIAGNOSIS — Z01419 Encounter for gynecological examination (general) (routine) without abnormal findings: Secondary | ICD-10-CM | POA: Diagnosis not present

## 2021-09-30 DIAGNOSIS — Z6820 Body mass index (BMI) 20.0-20.9, adult: Secondary | ICD-10-CM | POA: Diagnosis not present

## 2021-11-26 DIAGNOSIS — R0989 Other specified symptoms and signs involving the circulatory and respiratory systems: Secondary | ICD-10-CM | POA: Diagnosis not present

## 2021-11-26 DIAGNOSIS — B351 Tinea unguium: Secondary | ICD-10-CM | POA: Diagnosis not present

## 2021-11-26 DIAGNOSIS — Z79899 Other long term (current) drug therapy: Secondary | ICD-10-CM | POA: Diagnosis not present

## 2021-11-26 DIAGNOSIS — R9431 Abnormal electrocardiogram [ECG] [EKG]: Secondary | ICD-10-CM | POA: Diagnosis not present

## 2021-11-26 DIAGNOSIS — E871 Hypo-osmolality and hyponatremia: Secondary | ICD-10-CM | POA: Diagnosis not present

## 2021-11-26 DIAGNOSIS — R946 Abnormal results of thyroid function studies: Secondary | ICD-10-CM | POA: Diagnosis not present

## 2021-11-26 DIAGNOSIS — I10 Essential (primary) hypertension: Secondary | ICD-10-CM | POA: Diagnosis not present

## 2021-11-26 DIAGNOSIS — E878 Other disorders of electrolyte and fluid balance, not elsewhere classified: Secondary | ICD-10-CM | POA: Diagnosis not present

## 2021-11-26 DIAGNOSIS — E559 Vitamin D deficiency, unspecified: Secondary | ICD-10-CM | POA: Diagnosis not present

## 2021-11-26 DIAGNOSIS — Z0001 Encounter for general adult medical examination with abnormal findings: Secondary | ICD-10-CM | POA: Diagnosis not present

## 2021-12-15 ENCOUNTER — Other Ambulatory Visit: Payer: Medicare HMO

## 2021-12-15 ENCOUNTER — Other Ambulatory Visit: Payer: Self-pay

## 2021-12-15 DIAGNOSIS — R0989 Other specified symptoms and signs involving the circulatory and respiratory systems: Secondary | ICD-10-CM | POA: Diagnosis not present

## 2021-12-16 ENCOUNTER — Other Ambulatory Visit: Payer: Self-pay | Admitting: Internal Medicine

## 2021-12-21 ENCOUNTER — Other Ambulatory Visit: Payer: Self-pay | Admitting: Internal Medicine

## 2021-12-21 DIAGNOSIS — R0989 Other specified symptoms and signs involving the circulatory and respiratory systems: Secondary | ICD-10-CM

## 2021-12-24 DIAGNOSIS — I779 Disorder of arteries and arterioles, unspecified: Secondary | ICD-10-CM | POA: Diagnosis not present

## 2021-12-24 DIAGNOSIS — I10 Essential (primary) hypertension: Secondary | ICD-10-CM | POA: Diagnosis not present

## 2022-01-13 DIAGNOSIS — H40013 Open angle with borderline findings, low risk, bilateral: Secondary | ICD-10-CM | POA: Diagnosis not present

## 2022-01-13 DIAGNOSIS — Z961 Presence of intraocular lens: Secondary | ICD-10-CM | POA: Diagnosis not present

## 2022-01-18 IMAGING — MG MM DIGITAL DIAGNOSTIC UNILAT*L* W/ TOMO W/ CAD
4 series · 4 of 12 positions shown · non-contrast
Comparison: Previous exam(s).

CLINICAL DATA: Possible asymmetry in the posterior central left
breast in the craniocaudal projection of a recent screening
mammogram.

EXAM:
DIGITAL DIAGNOSTIC UNILATERAL LEFT MAMMOGRAM WITH TOMO AND CAD

[L CC synth-2D]
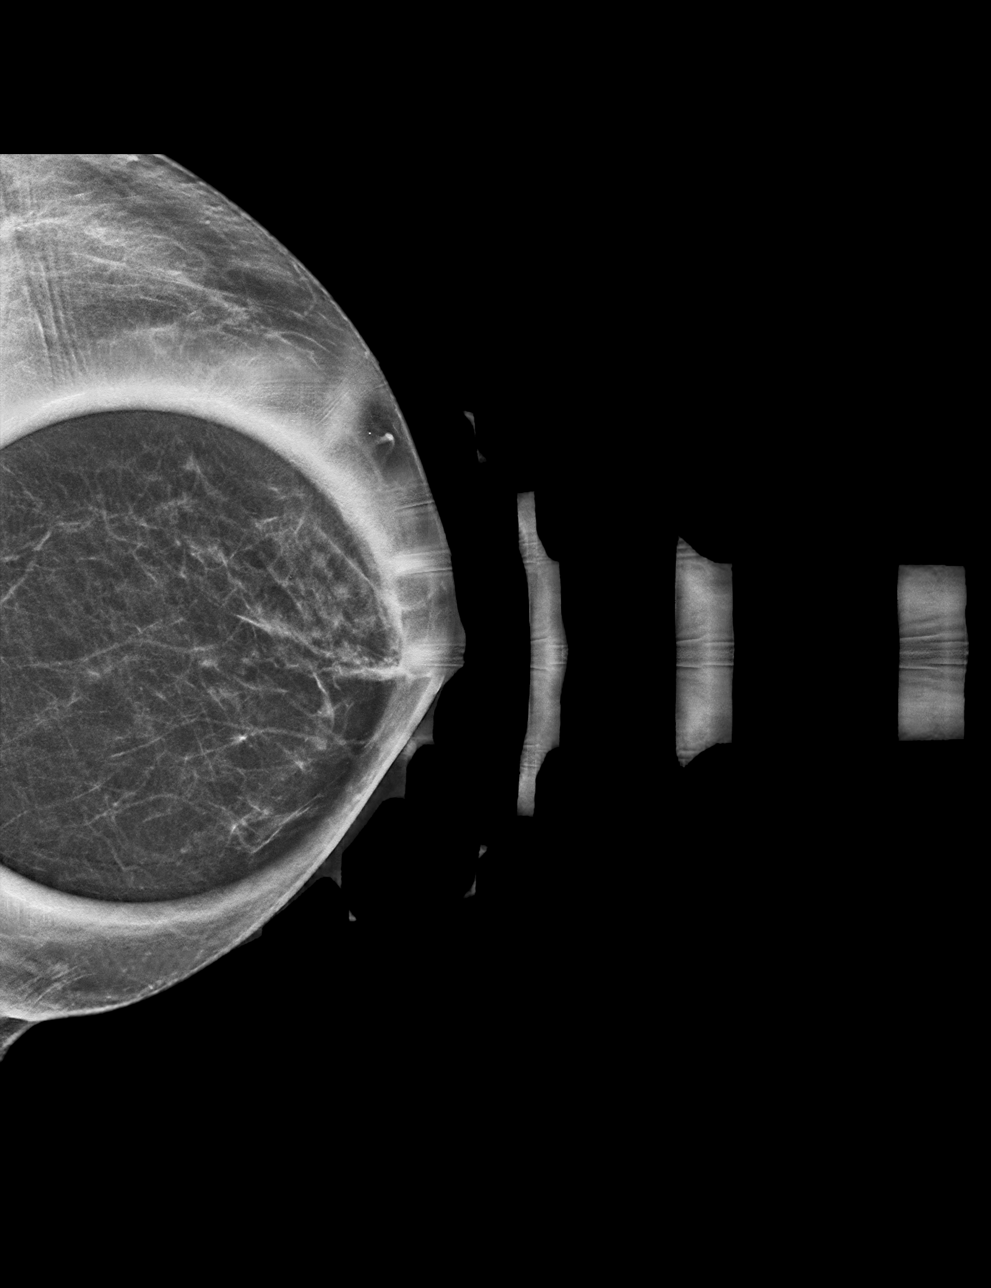

[L ML synth-2D]
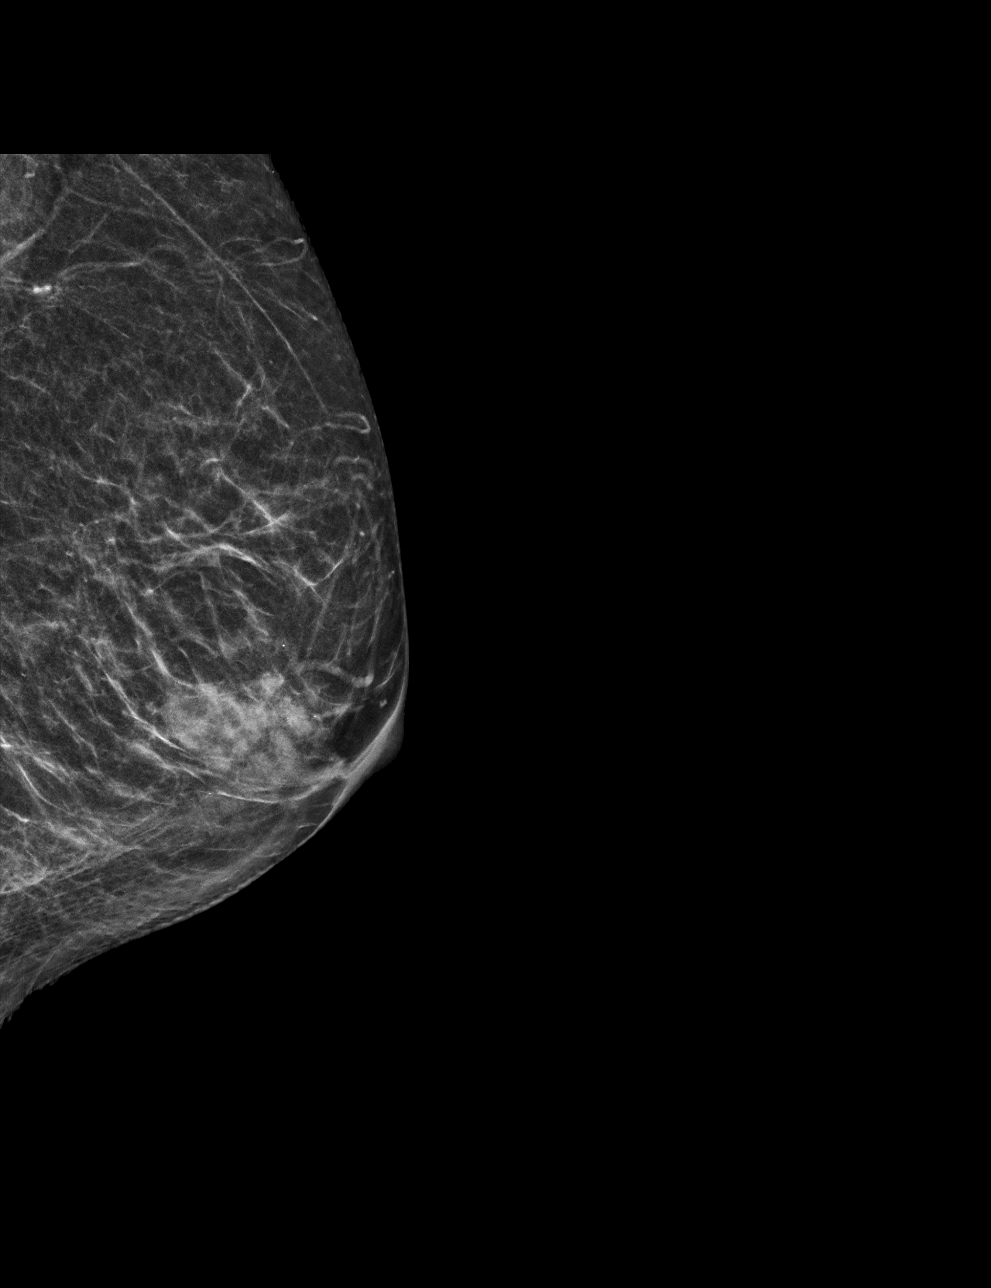

[L CC tomo · tomo slice 23/45.0]
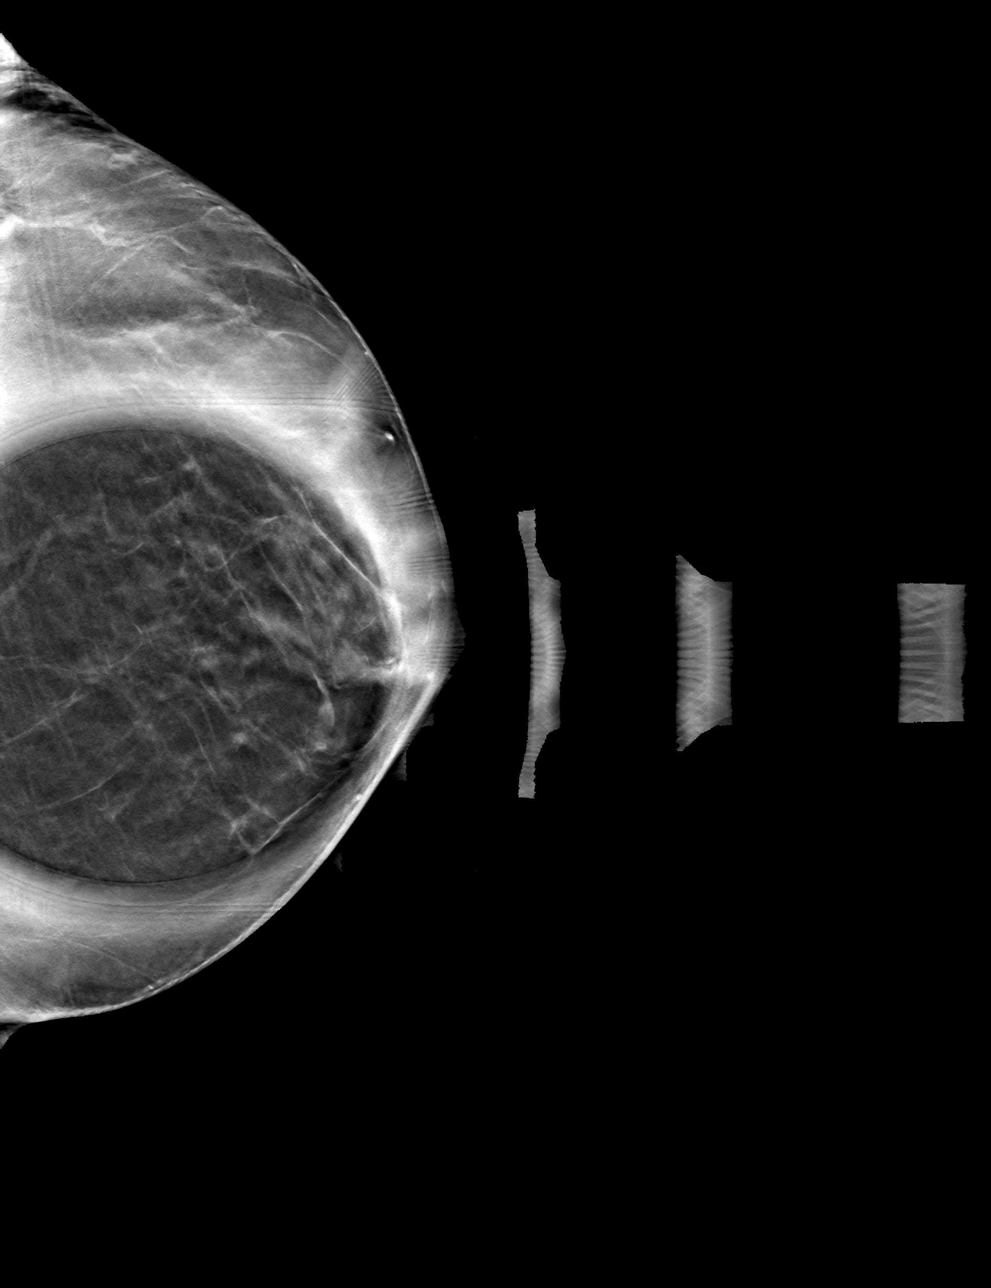

[L ML tomo · tomo slice 25/48.0]
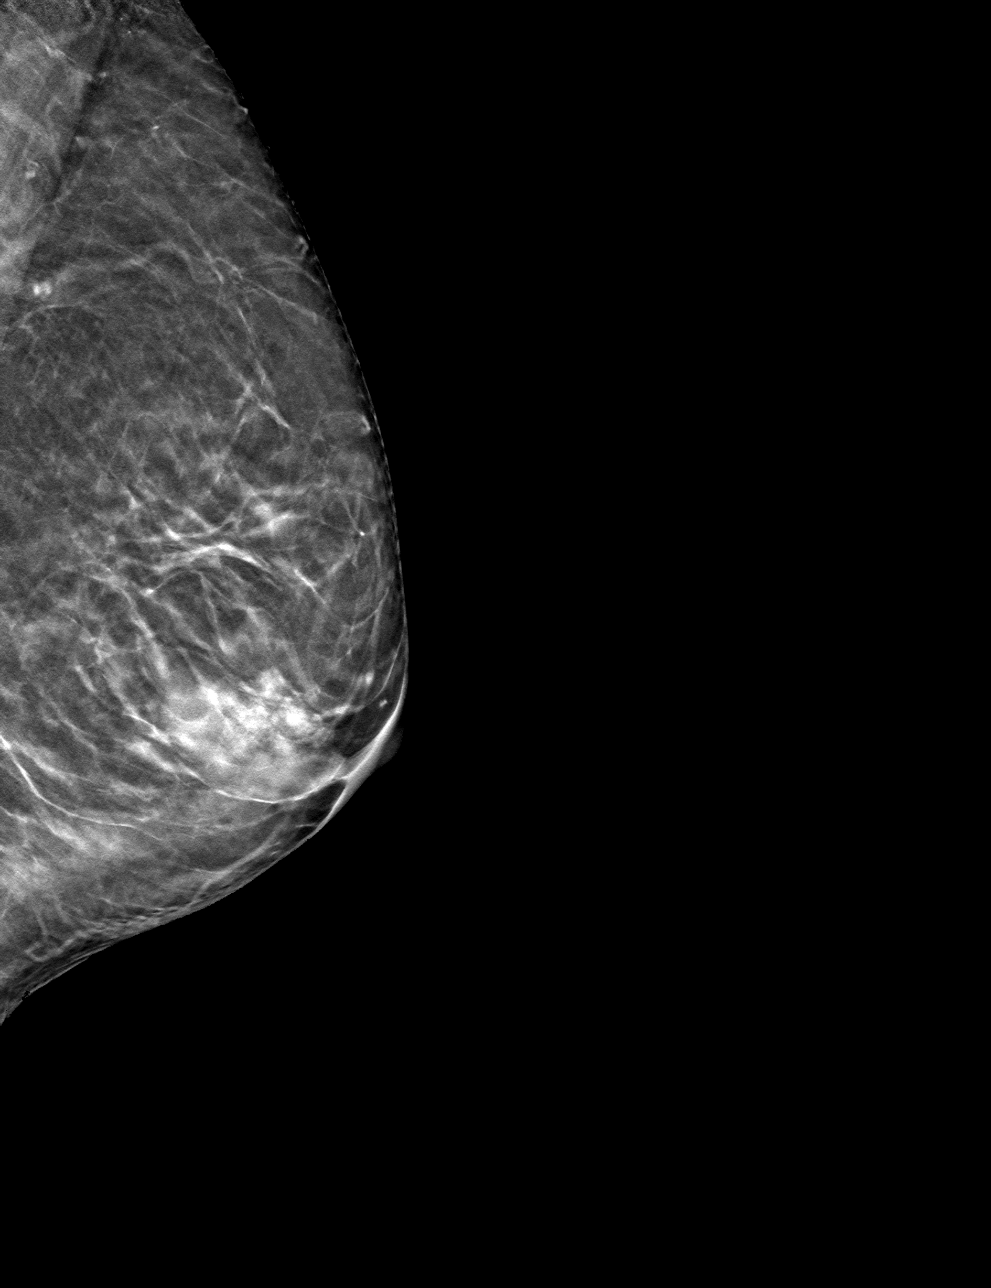

[4 of 12 positions shown; findings below may reference images not displayed]

ACR Breast Density Category c: The breast tissue is heterogeneously
dense, which may obscure small masses.
FINDINGS: 3D tomographic and 2D generated true lateral and spot compression
craniocaudal views of the left breast demonstrate normal appearing
fibroglandular tissue at the location of the recently suspected
asymmetry, unchanged compared previous examinations. No asymmetry
was seen in the oblique projection of the recent screening
mammogram.

Mammographic images were processed with CAD.
IMPRESSION: No evidence of malignancy. The recently suspected left breast
asymmetry was close apposition of normal breast tissue.

RECOMMENDATION:
Bilateral screening mammogram in 1 year.

I have discussed the findings and recommendations with the patient.
If applicable, a reminder letter will be sent to the patient
regarding the next appointment.

BI-RADS CATEGORY  1: Negative.

## 2022-06-17 ENCOUNTER — Other Ambulatory Visit: Payer: Self-pay | Admitting: Obstetrics & Gynecology

## 2022-06-17 DIAGNOSIS — Z1231 Encounter for screening mammogram for malignant neoplasm of breast: Secondary | ICD-10-CM

## 2022-08-04 ENCOUNTER — Ambulatory Visit
Admission: RE | Admit: 2022-08-04 | Discharge: 2022-08-04 | Disposition: A | Discharge status: Home / Self Care | Payer: Medicare HMO | Source: Ambulatory Visit | Attending: Obstetrics & Gynecology | Admitting: Obstetrics & Gynecology

## 2022-08-04 DIAGNOSIS — Z1231 Encounter for screening mammogram for malignant neoplasm of breast: Secondary | ICD-10-CM

## 2022-10-05 DIAGNOSIS — M1712 Unilateral primary osteoarthritis, left knee: Secondary | ICD-10-CM | POA: Diagnosis not present

## 2022-10-21 DIAGNOSIS — Z01419 Encounter for gynecological examination (general) (routine) without abnormal findings: Secondary | ICD-10-CM | POA: Diagnosis not present

## 2022-10-21 DIAGNOSIS — M8588 Other specified disorders of bone density and structure, other site: Secondary | ICD-10-CM | POA: Diagnosis not present

## 2022-10-21 DIAGNOSIS — Z6820 Body mass index (BMI) 20.0-20.9, adult: Secondary | ICD-10-CM | POA: Diagnosis not present

## 2023-01-13 DIAGNOSIS — E782 Mixed hyperlipidemia: Secondary | ICD-10-CM | POA: Diagnosis not present

## 2023-01-13 DIAGNOSIS — M8589 Other specified disorders of bone density and structure, multiple sites: Secondary | ICD-10-CM | POA: Diagnosis not present

## 2023-01-13 DIAGNOSIS — E559 Vitamin D deficiency, unspecified: Secondary | ICD-10-CM | POA: Diagnosis not present

## 2023-01-13 DIAGNOSIS — Z0000 Encounter for general adult medical examination without abnormal findings: Secondary | ICD-10-CM | POA: Diagnosis not present

## 2023-01-13 DIAGNOSIS — I10 Essential (primary) hypertension: Secondary | ICD-10-CM | POA: Diagnosis not present

## 2023-02-04 DIAGNOSIS — E871 Hypo-osmolality and hyponatremia: Secondary | ICD-10-CM | POA: Diagnosis not present

## 2023-03-17 DIAGNOSIS — Z961 Presence of intraocular lens: Secondary | ICD-10-CM | POA: Diagnosis not present

## 2023-03-17 DIAGNOSIS — H40013 Open angle with borderline findings, low risk, bilateral: Secondary | ICD-10-CM | POA: Diagnosis not present

## 2023-03-29 ENCOUNTER — Ambulatory Visit: Payer: Medicare HMO | Admitting: Family Medicine

## 2023-06-24 ENCOUNTER — Other Ambulatory Visit: Payer: Self-pay | Admitting: Internal Medicine

## 2023-06-24 DIAGNOSIS — Z1231 Encounter for screening mammogram for malignant neoplasm of breast: Secondary | ICD-10-CM

## 2023-08-11 ENCOUNTER — Ambulatory Visit
Admission: RE | Admit: 2023-08-11 | Discharge: 2023-08-11 | Disposition: A | Discharge status: Home / Self Care | Payer: Medicare HMO | Source: Ambulatory Visit | Attending: Internal Medicine | Admitting: Internal Medicine

## 2023-08-11 DIAGNOSIS — Z1231 Encounter for screening mammogram for malignant neoplasm of breast: Secondary | ICD-10-CM

## 2023-11-15 DIAGNOSIS — Z6820 Body mass index (BMI) 20.0-20.9, adult: Secondary | ICD-10-CM | POA: Diagnosis not present

## 2023-11-15 DIAGNOSIS — Z01419 Encounter for gynecological examination (general) (routine) without abnormal findings: Secondary | ICD-10-CM | POA: Diagnosis not present

## 2023-11-22 DIAGNOSIS — M1712 Unilateral primary osteoarthritis, left knee: Secondary | ICD-10-CM | POA: Diagnosis not present

## 2024-01-17 DIAGNOSIS — I779 Disorder of arteries and arterioles, unspecified: Secondary | ICD-10-CM | POA: Diagnosis not present

## 2024-01-17 DIAGNOSIS — Z23 Encounter for immunization: Secondary | ICD-10-CM | POA: Diagnosis not present

## 2024-01-17 DIAGNOSIS — Z0000 Encounter for general adult medical examination without abnormal findings: Secondary | ICD-10-CM | POA: Diagnosis not present

## 2024-01-17 DIAGNOSIS — Z6820 Body mass index (BMI) 20.0-20.9, adult: Secondary | ICD-10-CM | POA: Diagnosis not present

## 2024-01-17 DIAGNOSIS — M170 Bilateral primary osteoarthritis of knee: Secondary | ICD-10-CM | POA: Diagnosis not present

## 2024-01-17 DIAGNOSIS — M8589 Other specified disorders of bone density and structure, multiple sites: Secondary | ICD-10-CM | POA: Diagnosis not present

## 2024-01-17 DIAGNOSIS — Z1331 Encounter for screening for depression: Secondary | ICD-10-CM | POA: Diagnosis not present

## 2024-01-17 DIAGNOSIS — I10 Essential (primary) hypertension: Secondary | ICD-10-CM | POA: Diagnosis not present

## 2024-01-27 DIAGNOSIS — E871 Hypo-osmolality and hyponatremia: Secondary | ICD-10-CM | POA: Diagnosis not present

## 2024-02-01 NOTE — Therapy (Addendum)
 OUTPATIENT PHYSICAL THERAPY LOWER EXTREMITY EVALUATION   Patient Name: [name] MRN: [id number] DOB:July 23, 1941, 83 y.o., female Today's Date: 02/02/2024  END OF SESSION:  PT End of Session - 02/02/24 1108     Visit Number 1    Date for PT Re-Evaluation 03/29/24    Authorization Type [employer]    Progress Note Due on Visit 10    PT Start Time 1017    PT Stop Time 1105    PT Time Calculation (min) 48 min    Activity Tolerance Patient tolerated treatment well    Behavior During Therapy WFL for tasks assessed/performed             Past Medical History:  Diagnosis Date   Atrophic vaginitis    Elevated cholesterol    Fibroid    Submucous myoma   Hypertension    Osteopenia 2011   T score -1.2 FRAX 7.6%/0.8%   Past Surgical History:  Procedure Laterality Date   CESAREAN SECTION     X2   HYSTEROSCOPY     Resect myoma   TUBAL LIGATION     Patient Active Problem List   Diagnosis Date Noted   Osteopenia    Fibroid    Atrophic vaginitis    Hypertension    Elevated cholesterol     PCP: Dyanna Glasgow   REFERRING PROVIDER: Perley Bradley, MD  REFERRING DIAG: M17.0 (ICD-10-CM) - Primary osteoarthritis of both knees   THERAPY DIAG:  Chronic pain of left knee - Plan: PT plan of care cert/re-cert  Chronic pain of right knee - Plan: PT plan of care cert/re-cert  Other abnormalities of gait and mobility - Plan: PT plan of care cert/re-cert  Muscle weakness (generalized) - Plan: PT plan of care cert/re-cert  Rationale for Evaluation and Treatment: Rehabilitation  ONSET DATE: chronic- stopped walking in Fall 2024  SUBJECTIVE:   SUBJECTIVE STATEMENT: Pt presents to PT with bil knee pain and OA. 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  MD told her to stop walking for exercise as she might fall and break a hip.  She stopped walking in Fall 2024 and would like  to get back to this.    PERTINENT HISTORY: Osteopenia T score -1.2  PAIN: 02/02/24 Are you having pain? Yes: NPRS scale: 5-6/10 Pain location: Lt >Rt knees- lateral  Pain description: shooting  Aggravating factors: walking, standing Relieving factors: ice, Advil  PRECAUTIONS: Fall  RED FLAGS: None   WEIGHT BEARING RESTRICTIONS: No  FALLS:  Has patient fallen in last 6 months? Yes. Number of falls 1- in bathroom putting on pants  LIVING ENVIRONMENT: Lives with: lives with their spouse Lives in: House/apartment Stairs: Yes: Internal: 12 steps; on right going up Has following equipment at home: Walker - 4 wheeled and Grab bars  OCCUPATION: retired   PLOF: Independent and Leisure: walking for exercise-has not been doing this due to pain  PATIENT GOALS: return to walking, strengthen legs, reduce falls risk   OBJECTIVE:  Note: Objective measures were completed at Evaluation unless otherwise noted.  DIAGNOSTIC FINDINGS:  06/2023: X-ray: bil OA in knees   PATIENT SURVEYS:  02/02/24: LEFS 45/80=56% 6 min walk test: 1151 (age related norm is 1332 feet)  COGNITION: Overall cognitive status: Within functional limits for tasks assessed     SENSATION: WFL   MUSCLE LENGTH: Hamstring limited 25% bil  POSTURE: forward  head and genu valgus Lt>Rt  PALPATION: Crepitus in bil knees, palpable tenderness over lateral knee joint  LOWER EXTREMITY ROM: Lacking 10 degrees of bil knee extension, full knee flexion   LOWER EXTREMITY MMT: Lt hip 4/5, knee 4/5, Lt hip 4+/5, knee 5/5  FUNCTIONAL TESTS:  5 times sit to stand: 11.11 with hip adduction  GAIT: Distance walked: 100 Assistive device utilized: None Level of assistance: Complete Independence Comments: genu valgus and trendelenburg                                                                                                                                TREATMENT DATE:  02/02/24 Findings from evaluation discussed, pt  educated on plan of care, HEP initiated.   Educated pt in walking short distance at first and use walking stick or rollator   PATIENT EDUCATION:  Education details: Access Code: [email or web address] Person educated: Patient Education method: Programmer, multimedia, Facilities manager, and Handouts Education comprehension: verbalized understanding and returned demonstration  HOME EXERCISE PROGRAM: Access Code: [email or web address] URL: [email or web address] Date: 02/02/2024 Prepared by: Loetta Ringer  Exercises - Sit to Stand Without Arm Support  - 2 x daily - 7 x weekly - 2 sets - 10 reps - Seated Long Arc Quad  - 3 x daily - 7 x weekly - 2 sets - 10 reps - 5 hold - Standing Hip Abduction with Counter Support  - 3 x daily - 7 x weekly - 2 sets - 10 reps - Seated Hamstring Stretch  - 2 x daily - 7 x weekly - 1 sets - 3 reps - 20 hold  ASSESSMENT:  CLINICAL IMPRESSION: Patient is a 83 y.o. female who was seen today for physical therapy evaluation and treatment for bil knee pain. She has significant OA and Lt>Rt genu valgus.  Pt was walking 3-4 miles 5x/wk until 06/2023 and Dr Marland Silvas told her to stop walking due to falls risk.  She cares for her husband and is not able to do knee replacement surgery at this time.  She ambulates with trendelenburg gait pattern and genu valgus on Lt>Rt.  Lt LE with strength deficits.  6 min walk is 1151 and age related norm is 1326ft.  PT educated pt on use of walking stick to get back to regular walking.  Patient will benefit from skilled PT to address the below impairments and improve overall function.   OBJECTIVE IMPAIRMENTS: Abnormal gait, decreased activity tolerance, decreased balance, decreased endurance, decreased mobility, difficulty walking, decreased ROM, decreased strength, hypomobility, increased muscle spasms, impaired flexibility, postural dysfunction, and pain.   ACTIVITY LIMITATIONS: standing, squatting, sleeping, stairs, locomotion level, and caring for  others  PARTICIPATION LIMITATIONS: cleaning, laundry, shopping, and community activity  PERSONAL FACTORS: Age, Time since onset of injury/illness/exacerbation, and 1 comorbidity: bil knee OA  are also affecting patient's functional outcome.   REHAB POTENTIAL: Good  CLINICAL DECISION MAKING: Stable/uncomplicated  EVALUATION COMPLEXITY: Low   GOALS: Goals reviewed with patient?  Yes  SHORT TERM GOALS: Target date: 03/01/2024   Be independent in initial HEP Baseline: Goal status: INITIAL  2.  Return to regular walking program and verbalize safe progression  Baseline:  Goal status: INITIAL  3.  Report > or = to 30% reduction in bil knee pain with standing and walking Baseline:  Goal status: INITIAL    LONG TERM GOALS: Target date: 03/29/2024    Be independent in advanced HEP Baseline:  Goal status: INITIAL  2.  Improve LEFS to > or = to 56/80 Baseline: 45/80 Goal status: INITIAL  3.  Improve Lt LE to return to walking 3-4 miles with walking stick Baseline:  Goal status: INITIAL  4.  Improve 6 min walk test to > or = to 1300 to achieve age related norm Baseline:  Goal status: INITIAL  5.  Report > or = to 50% reduction in knee pain with standing and walking Baseline:  Goal status: INITIAL    PLAN:  PT FREQUENCY: 1-2x/week  PT DURATION: 8 weeks  PLANNED INTERVENTIONS: 97110-Therapeutic exercises, 97530- Therapeutic activity, 97112- Neuromuscular re-education, 97535- Self Care, 40981- Manual therapy, [phone]- Gait training, [phone]- Canalith repositioning, [id number]- Aquatic Therapy, [phone]- Electrical stimulation (unattended), [phone]- Electrical stimulation (manual), [id number]- Vasopneumatic device, [id number]- Ionotophoresis 4mg /ml Dexamethasone, Patient/Family education, Balance training, Taping, Dry Needling, Joint mobilization, Joint manipulation, Vestibular training, Cryotherapy, and Moist heat  PLAN FOR NEXT SESSION: work on alignment and hip/knee strength, sit to stand,  NuStep, see how short period of walking went   Stearns, [place]ledge 02/02/24 11:32 AM   [facility] [address], [address] [place], [place] [address] Phone # [phone] Fax [phone]

## 2024-02-02 ENCOUNTER — Ambulatory Visit: Attending: Family Medicine

## 2024-02-02 ENCOUNTER — Other Ambulatory Visit: Payer: Self-pay

## 2024-02-02 DIAGNOSIS — R2689 Other abnormalities of gait and mobility: Secondary | ICD-10-CM | POA: Diagnosis not present

## 2024-02-02 DIAGNOSIS — M6281 Muscle weakness (generalized): Secondary | ICD-10-CM | POA: Insufficient documentation

## 2024-02-02 DIAGNOSIS — G8929 Other chronic pain: Secondary | ICD-10-CM | POA: Insufficient documentation

## 2024-02-02 DIAGNOSIS — M25561 Pain in right knee: Secondary | ICD-10-CM | POA: Diagnosis not present

## 2024-02-02 DIAGNOSIS — M25562 Pain in left knee: Secondary | ICD-10-CM | POA: Insufficient documentation

## 2024-02-16 ENCOUNTER — Ambulatory Visit: Admitting: Physical Therapy

## 2024-02-16 ENCOUNTER — Encounter: Payer: Self-pay | Admitting: Physical Therapy

## 2024-02-16 DIAGNOSIS — R2689 Other abnormalities of gait and mobility: Secondary | ICD-10-CM | POA: Diagnosis not present

## 2024-02-16 DIAGNOSIS — G8929 Other chronic pain: Secondary | ICD-10-CM

## 2024-02-16 DIAGNOSIS — M6281 Muscle weakness (generalized): Secondary | ICD-10-CM | POA: Diagnosis not present

## 2024-02-16 DIAGNOSIS — M25562 Pain in left knee: Secondary | ICD-10-CM | POA: Diagnosis not present

## 2024-02-16 DIAGNOSIS — M25561 Pain in right knee: Secondary | ICD-10-CM | POA: Diagnosis not present

## 2024-02-16 NOTE — Therapy (Signed)
 OUTPATIENT PHYSICAL THERAPY LOWER EXTREMITY TREATMENT   Patient Name: [name] MRN: [id number] DOB:Jul 31, 1941, 83 y.o., female Today's Date: 02/16/2024  END OF SESSION:  PT End of Session - 02/16/24 1019     Visit Number 2    Date for PT Re-Evaluation 03/29/24    Authorization Type [employer]    Progress Note Due on Visit 10    PT Start Time 1017    PT Stop Time 1100    PT Time Calculation (min) 43 min    Activity Tolerance Patient tolerated treatment well    Behavior During Therapy WFL for tasks assessed/performed              Past Medical History:  Diagnosis Date   Atrophic vaginitis    Elevated cholesterol    Fibroid    Submucous myoma   Hypertension    Osteopenia 2011   T score -1.2 FRAX 7.6%/0.8%   Past Surgical History:  Procedure Laterality Date   CESAREAN SECTION     X2   HYSTEROSCOPY     Resect myoma   TUBAL LIGATION     Patient Active Problem List   Diagnosis Date Noted   Osteopenia    Fibroid    Atrophic vaginitis    Hypertension    Elevated cholesterol     PCP: Dyanna Glasgow   REFERRING PROVIDER: Perley Bradley, MD  REFERRING DIAG: M17.0 (ICD-10-CM) - Primary osteoarthritis of both knees   THERAPY DIAG:  Chronic pain of left knee  Chronic pain of right knee  Other abnormalities of gait and mobility  Muscle weakness (generalized)  Rationale for Evaluation and Treatment: Rehabilitation  ONSET DATE: chronic- stopped walking in Fall 2024  SUBJECTIVE:   SUBJECTIVE STATEMENT: I am doing the exercises.  No problem with them.   Eval: Pt presents to PT with bil knee pain and OA. 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  MD told her to stop walking for exercise as she might fall and break a hip.  She stopped walking in Fall 2024 and would like to get back to this.    PERTINENT HISTORY: Osteopenia T score -1.2  PAIN:  02/02/24 Are you having pain? Yes: NPRS scale: 5/10 Pain location: Lt >Rt knees- lateral  Pain description: shooting  Aggravating factors: walking, standing Relieving factors: ice, Advil  PRECAUTIONS: Fall  RED FLAGS: None   WEIGHT BEARING RESTRICTIONS: No  FALLS:  Has patient fallen in last 6 months? Yes. Number of falls 1- in bathroom putting on pants  LIVING ENVIRONMENT: Lives with: lives with their spouse Lives in: House/apartment Stairs: Yes: Internal: 12 steps; on right going up Has following equipment at home: Walker - 4 wheeled and Grab bars  OCCUPATION: retired   PLOF: Independent and Leisure: walking for exercise-has not been doing this due to pain  PATIENT GOALS: return to walking, strengthen legs, reduce falls risk   OBJECTIVE:  Note: Objective measures were completed at Evaluation unless otherwise noted.  DIAGNOSTIC FINDINGS:  06/2023: X-ray: bil OA in knees   PATIENT SURVEYS:  02/02/24: LEFS 45/80=56% 6 min walk test: 1151 (age related norm is 1332 feet)  COGNITION: Overall cognitive status: Within functional limits for tasks assessed     SENSATION: WFL   MUSCLE LENGTH: Hamstring limited 25% bil  POSTURE: forward head and genu valgus Lt>Rt  PALPATION: Crepitus in bil knees, palpable tenderness over  lateral knee joint  LOWER EXTREMITY ROM: Lacking 10 degrees of bil knee extension, full knee flexion   LOWER EXTREMITY MMT: Lt hip 4/5, knee 4/5, Lt hip 4+/5, knee 5/5  FUNCTIONAL TESTS:  5 times sit to stand: 11.11 with hip adduction  GAIT: Distance walked: 100 Assistive device utilized: None Level of assistance: Complete Independence Comments: genu valgus and trendelenburg                                                                                                                                TREATMENT DATE:  02/16/24: NuStep L3 x 5' Pt present to discuss status Seated HS stretch bil 2x30" Seated bil clam blue loop x10 Sit to stand  from mat table with blue loop at knees, fwd reach x10 Seated clam bil x10 and single clam x 5 each - red loop Supine bridge 1x10 SL clam 1x10 yellow loop Standing hip abd at barre 2x10 bil Updated HEP - gave green tied band Reviewed patterning gait with a wooden dowel to simulate walking stick - using in Rt hand paired with Lt foot advancement - discussion about balancing resistance to using an AD with benefits of walking with safety  02/02/24 Findings from evaluation discussed, pt educated on plan of care, HEP initiated.   Educated pt in walking short distance at first and use walking stick or rollator   PATIENT EDUCATION:  Education details: Access Code: [email or web address] Person educated: Patient Education method: Programmer, multimedia, Facilities manager, and Handouts Education comprehension: verbalized understanding and returned demonstration  HOME EXERCISE PROGRAM: Access Code: [email or web address] URL: [email or web address] Date: 02/16/2024 Prepared by: Minor Amble Mystique Bjelland  Exercises - Sit to Stand with Resistance Around Legs  - 2 x daily - 7 x weekly - 2 sets - 10 reps - Seated Long Arc Quad  - 3 x daily - 7 x weekly - 2 sets - 10 reps - 5 hold - Standing Hip Abduction with Counter Support  - 3 x daily - 7 x weekly - 2 sets - 10 reps - Seated Hamstring Stretch  - 2 x daily - 7 x weekly - 1 sets - 3 reps - 20 hold - Supine Bridge  - 1 x daily - 7 x weekly - 2 sets - 10 reps  ASSESSMENT:  CLINICAL IMPRESSION: Patient has been compliant with HEP.  She was able to use light resistance band around knees today to improve her alignment with sit to stand.  She has been increasing her indoor walking but has yet to try outdoors - we discussed and trialed the use of a wooden dowel to simulate a walking stick as Pt is not ready to accept needing to use a cane.  She could also go walk at a park with her daughter which PT encouraged.  PT advanced HEP for knee alignment with sit to stand and added supine bridges  today.  OBJECTIVE IMPAIRMENTS: Abnormal gait, decreased activity tolerance, decreased balance, decreased endurance, decreased  mobility, difficulty walking, decreased ROM, decreased strength, hypomobility, increased muscle spasms, impaired flexibility, postural dysfunction, and pain.   ACTIVITY LIMITATIONS: standing, squatting, sleeping, stairs, locomotion level, and caring for others  PARTICIPATION LIMITATIONS: cleaning, laundry, shopping, and community activity  PERSONAL FACTORS: Age, Time since onset of injury/illness/exacerbation, and 1 comorbidity: bil knee OA are also affecting patient's functional outcome.   REHAB POTENTIAL: Good  CLINICAL DECISION MAKING: Stable/uncomplicated  EVALUATION COMPLEXITY: Low   GOALS: Goals reviewed with patient? Yes  SHORT TERM GOALS: Target date: 03/01/2024   Be independent in initial HEP Baseline: Goal status: MET, 5/15  2.  Return to regular walking program and verbalize safe progression  Baseline:  Goal status: ONGOING DISCUSSION 5/15  3.  Report > or = to 30% reduction in bil knee pain with standing and walking Baseline:  Goal status: INITIAL    LONG TERM GOALS: Target date: 03/29/2024    Be independent in advanced HEP Baseline:  Goal status: INITIAL  2.  Improve LEFS to > or = to 56/80 Baseline: 45/80 Goal status: INITIAL  3.  Improve Lt LE to return to walking 3-4 miles with walking stick Baseline:  Goal status: INITIAL  4.  Improve 6 min walk test to > or = to 1300 to achieve age related norm Baseline:  Goal status: INITIAL  5.  Report > or = to 50% reduction in knee pain with standing and walking Baseline:  Goal status: INITIAL    PLAN:  PT FREQUENCY: 1-2x/week  PT DURATION: 8 weeks  PLANNED INTERVENTIONS: 97110-Therapeutic exercises, 97530- Therapeutic activity, [id number]- Neuromuscular re-education, 97535- Self Care, [id number]- Manual therapy, [id number]- Gait training, [phone]- Canalith repositioning, [id number]- Aquatic  Therapy, [phone]- Electrical stimulation (unattended), [phone]- Electrical stimulation (manual), 97016- Vasopneumatic device, 97033- Ionotophoresis 4mg /ml Dexamethasone, Patient/Family education, Balance training, Taping, Dry Needling, Joint mobilization, Joint manipulation, Vestibular training, Cryotherapy, and Moist heat  PLAN FOR NEXT SESSION: work on alignment and hip/knee strength, sit to stand, NuStep, see how short period of walking went - walking stick?   Raynell Caller, PT 02/16/24 12:04 PM   [facility] [address] [place], [place] [address] Phone # [phone] Fax [phone]

## 2024-02-23 ENCOUNTER — Ambulatory Visit

## 2024-02-23 DIAGNOSIS — R2689 Other abnormalities of gait and mobility: Secondary | ICD-10-CM | POA: Diagnosis not present

## 2024-02-23 DIAGNOSIS — G8929 Other chronic pain: Secondary | ICD-10-CM | POA: Diagnosis not present

## 2024-02-23 DIAGNOSIS — M6281 Muscle weakness (generalized): Secondary | ICD-10-CM | POA: Diagnosis not present

## 2024-02-23 DIAGNOSIS — M25562 Pain in left knee: Secondary | ICD-10-CM | POA: Diagnosis not present

## 2024-02-23 DIAGNOSIS — M25561 Pain in right knee: Secondary | ICD-10-CM | POA: Diagnosis not present

## 2024-02-23 NOTE — Therapy (Signed)
 OUTPATIENT PHYSICAL THERAPY LOWER EXTREMITY TREATMENT   Patient Name: [name] MRN: [id number] DOB:21-Aug-1941, 83 y.o., female Today's Date: 02/23/2024  END OF SESSION:  PT End of Session - 02/23/24 1059     Visit Number 3    Date for PT Re-Evaluation 03/29/24    Authorization Type [employer]    Progress Note Due on Visit 10    PT Start Time 1017    PT Stop Time 1059    PT Time Calculation (min) 42 min    Activity Tolerance Patient tolerated treatment well    Behavior During Therapy WFL for tasks assessed/performed               Past Medical History:  Diagnosis Date   Atrophic vaginitis    Elevated cholesterol    Fibroid    Submucous myoma   Hypertension    Osteopenia 2011   T score -1.2 FRAX 7.6%/0.8%   Past Surgical History:  Procedure Laterality Date   CESAREAN SECTION     X2   HYSTEROSCOPY     Resect myoma   TUBAL LIGATION     Patient Active Problem List   Diagnosis Date Noted   Osteopenia    Fibroid    Atrophic vaginitis    Hypertension    Elevated cholesterol     PCP: Dyanna Glasgow   REFERRING PROVIDER: Perley Bradley, MD  REFERRING DIAG: M17.0 (ICD-10-CM) - Primary osteoarthritis of both knees   THERAPY DIAG:  Chronic pain of left knee  Chronic pain of right knee  Other abnormalities of gait and mobility  Muscle weakness (generalized)  Rationale for Evaluation and Treatment: Rehabilitation  ONSET DATE: chronic- stopped walking in Fall 2024  SUBJECTIVE:   SUBJECTIVE STATEMENT: I am not able to do the squat with band, my balance is not good.  I've been walking around my house for exercise. The backs of my legs feel very tight.    Eval: Pt presents to PT with bil knee pain and OA. 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  MD told her to stop walking for exercise as she might fall and break a hip.  She stopped walking in Fall  2024 and would like to get back to this.    PERTINENT HISTORY: Osteopenia T score -1.2  PAIN: 02/23/24 Are you having pain? Yes: NPRS scale: 5/10 Pain location: Lt >Rt knees- lateral  Pain description: shooting  Aggravating factors: walking, standing Relieving factors: ice, Advil  PRECAUTIONS: Fall  RED FLAGS: None   WEIGHT BEARING RESTRICTIONS: No  FALLS:  Has patient fallen in last 6 months? Yes. Number of falls 1- in bathroom putting on pants  LIVING ENVIRONMENT: Lives with: lives with their spouse Lives in: House/apartment Stairs: Yes: Internal: 12 steps; on right going up Has following equipment at home: Walker - 4 wheeled and Grab bars  OCCUPATION: retired   PLOF: Independent and Leisure: walking for exercise-has not been doing this due to pain  PATIENT GOALS: return to walking, strengthen legs, reduce falls risk   OBJECTIVE:  Note: Objective measures were completed at Evaluation unless otherwise noted.  DIAGNOSTIC FINDINGS:  06/2023: X-ray: bil OA in knees   PATIENT SURVEYS:  02/02/24: LEFS 45/80=56% 6 min walk test: 1151 (age related norm is 1332 feet)  COGNITION: Overall cognitive status: Within functional limits for tasks assessed     SENSATION: [facility]  MUSCLE LENGTH: Hamstring limited 25% bil  POSTURE: forward head and genu valgus Lt>Rt  PALPATION: Crepitus in bil knees, palpable tenderness over lateral knee joint  LOWER EXTREMITY ROM: Lacking 10 degrees of bil knee extension, full knee flexion   LOWER EXTREMITY MMT: Lt hip 4/5, knee 4/5, Lt hip 4+/5, knee 5/5  FUNCTIONAL TESTS:  5 times sit to stand: 11.11 with hip adduction  GAIT: Distance walked: 100 Assistive device utilized: None Level of assistance: Complete Independence Comments: genu valgus and trendelenburg                                                                                                                                TREATMENT DATE:   02/23/24: NuStep L3 x 6' Pt  present to discuss status Seated HS stretch bil 2x30" Seated bil clam green theraband 2x10 Sit to stand from chair with green band around thighs x10- did with counter top next to her to improve safety and feeling of stability Seated clam bil x10 and single clam x 5 each - red loop Seated hamstring curl: green theraband 2x10 bil  Weight shifting at barre:  Standing hip abd at barre 2x10 bil   02/16/24: NuStep L3 x 5' Pt present to discuss status Seated HS stretch bil 2x30" Seated bil clam blue loop x10 Sit to stand from mat table with blue loop at knees, fwd reach x10 Seated clam bil x10 and single clam x 5 each - red loop Supine bridge 1x10 SL clam 1x10 yellow loop Standing hip abd at barre 2x10 bil Updated HEP - gave green tied band Reviewed patterning gait with a wooden dowel to simulate walking stick - using in Rt hand paired with Lt foot advancement - discussion about balancing resistance to using an AD with benefits of walking with safety  02/02/24 Findings from evaluation discussed, pt educated on plan of care, HEP initiated.   Educated pt in walking short distance at first and use walking stick or rollator   PATIENT EDUCATION:  Education details: Access Code: [email or web address] Person educated: Patient Education method: Programmer, multimedia, Facilities manager, and Handouts Education comprehension: verbalized understanding and returned demonstration  HOME EXERCISE PROGRAM: Access Code: [email or web address] URL: [email or web address] Date: 02/16/2024 Prepared by: Minor Amble Beuhring  Exercises - Sit to Stand with Resistance Around Legs  - 2 x daily - 7 x weekly - 2 sets - 10 reps - Seated Long Arc Quad  - 3 x daily - 7 x weekly - 2 sets - 10 reps - 5 hold - Standing Hip Abduction with Counter Support  - 3 x daily - 7 x weekly - 2 sets - 10 reps - Seated Hamstring Stretch  - 2 x daily - 7 x weekly - 1 sets - 3 reps - 20 hold - Supine Bridge  - 1 x daily - 7 x weekly - 2 sets - 10  reps  ASSESSMENT:  CLINICAL IMPRESSION: Pt reports that she was not able  to do sit to stand with band around thighs due to feeling off balance.  We practiced today with countertop next to her for safety and she did well with this.  She has been increasing her indoor walking but has yet to try outdoors.  She could also go walk at a park with her daughter which PT encouraged.  Pt is working on alignment with standing and walking. Patient will benefit from skilled PT to address the below impairments and improve overall function.   OBJECTIVE IMPAIRMENTS: Abnormal gait, decreased activity tolerance, decreased balance, decreased endurance, decreased mobility, difficulty walking, decreased ROM, decreased strength, hypomobility, increased muscle spasms, impaired flexibility, postural dysfunction, and pain.   ACTIVITY LIMITATIONS: standing, squatting, sleeping, stairs, locomotion level, and caring for others  PARTICIPATION LIMITATIONS: cleaning, laundry, shopping, and community activity  PERSONAL FACTORS: Age, Time since onset of injury/illness/exacerbation, and 1 comorbidity: bil knee OA are also affecting patient's functional outcome.   REHAB POTENTIAL: Good  CLINICAL DECISION MAKING: Stable/uncomplicated  EVALUATION COMPLEXITY: Low   GOALS: Goals reviewed with patient? Yes  SHORT TERM GOALS: Target date: 03/01/2024   Be independent in initial HEP Baseline: Goal status: MET, 5/15  2.  Return to regular walking program and verbalize safe progression  Baseline:  Goal status: ONGOING DISCUSSION 5/15  3.  Report > or = to 30% reduction in bil knee pain with standing and walking Baseline:  Goal status: INITIAL    LONG TERM GOALS: Target date: 03/29/2024    Be independent in advanced HEP Baseline:  Goal status: INITIAL  2.  Improve LEFS to > or = to 56/80 Baseline: 45/80 Goal status: INITIAL  3.  Improve Lt LE to return to walking 3-4 miles with walking stick Baseline:  Goal  status: INITIAL  4.  Improve 6 min walk test to > or = to 1300 to achieve age related norm Baseline:  Goal status: INITIAL  5.  Report > or = to 50% reduction in knee pain with standing and walking Baseline:  Goal status: INITIAL    PLAN:  PT FREQUENCY: 1-2x/week  PT DURATION: 8 weeks  PLANNED INTERVENTIONS: 97110-Therapeutic exercises, 97530- Therapeutic activity, 97112- Neuromuscular re-education, 97535- Self Care, 01027- Manual therapy, [phone]- Gait training, [phone]- Canalith repositioning, [id number]- Aquatic Therapy, [phone]- Electrical stimulation (unattended), [phone]- Electrical stimulation (manual), 97016- Vasopneumatic device, [id number]- Ionotophoresis 4mg /ml Dexamethasone, Patient/Family education, Balance training, Taping, Dry Needling, Joint mobilization, Joint manipulation, Vestibular training, Cryotherapy, and Moist heat  PLAN FOR NEXT SESSION: work on alignment and hip/knee strength, sit to stand, NuStep, see how short period of walking went    [place], [place] 02/23/24 11:00 AM    [facility] [address] [place], [place] [address] Phone # [phone] Fax [phone]

## 2024-03-01 ENCOUNTER — Ambulatory Visit

## 2024-03-01 DIAGNOSIS — M6281 Muscle weakness (generalized): Secondary | ICD-10-CM

## 2024-03-01 DIAGNOSIS — R2689 Other abnormalities of gait and mobility: Secondary | ICD-10-CM | POA: Diagnosis not present

## 2024-03-01 DIAGNOSIS — G8929 Other chronic pain: Secondary | ICD-10-CM

## 2024-03-01 DIAGNOSIS — M25562 Pain in left knee: Secondary | ICD-10-CM | POA: Diagnosis not present

## 2024-03-01 DIAGNOSIS — M25561 Pain in right knee: Secondary | ICD-10-CM | POA: Diagnosis not present

## 2024-03-01 NOTE — Therapy (Signed)
 OUTPATIENT PHYSICAL THERAPY LOWER EXTREMITY TREATMENT   Patient Name: [name] MRN: [id number] DOB:1941/08/22, 83 y.o., female Today's Date: 03/01/2024  END OF SESSION:  PT End of Session - 03/01/24 1059     Visit Number 4    Date for PT Re-Evaluation 03/29/24    Authorization Type [employer]    Progress Note Due on Visit 10    PT Start Time 1020    PT Stop Time 1101    PT Time Calculation (min) 41 min    Activity Tolerance Patient tolerated treatment well    Behavior During Therapy WFL for tasks assessed/performed                Past Medical History:  Diagnosis Date   Atrophic vaginitis    Elevated cholesterol    Fibroid    Submucous myoma   Hypertension    Osteopenia 2011   T score -1.2 FRAX 7.6%/0.8%   Past Surgical History:  Procedure Laterality Date   CESAREAN SECTION     X2   HYSTEROSCOPY     Resect myoma   TUBAL LIGATION     Patient Active Problem List   Diagnosis Date Noted   Osteopenia    Fibroid    Atrophic vaginitis    Hypertension    Elevated cholesterol     PCP: Dyanna Glasgow   REFERRING PROVIDER: Perley Bradley, MD  REFERRING DIAG: M17.0 (ICD-10-CM) - Primary osteoarthritis of both knees   THERAPY DIAG:  Chronic pain of left knee  Other abnormalities of gait and mobility  Chronic pain of right knee  Muscle weakness (generalized)  Rationale for Evaluation and Treatment: Rehabilitation  ONSET DATE: chronic- stopped walking in Fall 2024  SUBJECTIVE:   SUBJECTIVE STATEMENT: My Lt knee has been hurting more due to weather changes.    Eval: Pt presents to PT with bil knee pain and OA. 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  MD told her to stop walking for exercise as she might fall and break a hip.  She stopped walking in Fall 2024 and would like to get back to this.    PERTINENT HISTORY: Osteopenia T score  -1.2  PAIN: 03/01/24 Are you having pain? Yes: NPRS scale: 6/10 Pain location: Lt >Rt knees- lateral  Pain description: shooting  Aggravating factors: walking, standing Relieving factors: ice, Advil  PRECAUTIONS: Fall  RED FLAGS: None   WEIGHT BEARING RESTRICTIONS: No  FALLS:  Has patient fallen in last 6 months? Yes. Number of falls 1- in bathroom putting on pants  LIVING ENVIRONMENT: Lives with: lives with their spouse Lives in: House/apartment Stairs: Yes: Internal: 12 steps; on right going up Has following equipment at home: Walker - 4 wheeled and Grab bars  OCCUPATION: retired   PLOF: Independent and Leisure: walking for exercise-has not been doing this due to pain  PATIENT GOALS: return to walking, strengthen legs, reduce falls risk   OBJECTIVE:  Note: Objective measures were completed at Evaluation unless otherwise noted.  DIAGNOSTIC FINDINGS:  06/2023: X-ray: bil OA in knees   PATIENT SURVEYS:  02/02/24: LEFS 45/80=56% 6 min walk test: 1151 (age related norm is 1332 feet)  COGNITION: Overall cognitive status: Within functional limits for tasks assessed     SENSATION: WFL   MUSCLE LENGTH: Hamstring limited 25% bil  POSTURE: forward head and genu valgus Lt>Rt  PALPATION: Crepitus in bil  knees, palpable tenderness over lateral knee joint  LOWER EXTREMITY ROM: Lacking 10 degrees of bil knee extension, full knee flexion   LOWER EXTREMITY MMT: Lt hip 4/5, knee 4/5, Lt hip 4+/5, knee 5/5  FUNCTIONAL TESTS:  5 times sit to stand: 11.11 with hip adduction  GAIT: Distance walked: 100 Assistive device utilized: None Level of assistance: Complete Independence Comments: genu valgus and trendelenburg                                                                                                                                TREATMENT DATE:   03/01/24: NuStep L6 x 6' Pt present to discuss status Seated HS stretch bil 2x30" Seated bil clam green  theraband 2x10 Sit to stand from chair with activation of hip abductors x10 Seated clam bil x10 and single clam x 5 each - red loop Seated hamstring curl: green theraband 2x10 bil  Weight shifting at barre: on balance pad x1 min, 3 directions  Standing hip extension at barre 2x10 bil Sidestepping at barre  Rockerboard x3 min standing   02/23/24: NuStep L3 x 6' Pt present to discuss status Seated HS stretch bil 2x30" Seated bil clam green theraband 2x10 Sit to stand from chair with green band around thighs x10- did with counter top next to her to improve safety and feeling of stability Seated clam bil x10 and single clam x 5 each - red loop Seated hamstring curl: green theraband 2x10 bil  Weight shifting at barre:  Standing hip abd at barre 2x10 bil   02/16/24: NuStep L3 x 5' Pt present to discuss status Seated HS stretch bil 2x30" Seated bil clam blue loop x10 Sit to stand from mat table with blue loop at knees, fwd reach x10 Seated clam bil x10 and single clam x 5 each - red loop Supine bridge 1x10 SL clam 1x10 yellow loop Standing hip abd at barre 2x10 bil Updated HEP - gave green tied band Reviewed patterning gait with a wooden dowel to simulate walking stick - using in Rt hand paired with Lt foot advancement - discussion about balancing resistance to using an AD with benefits of walking with safety   PATIENT EDUCATION:  Education details: Access Code: [email or web address] Person educated: Patient Education method: Explanation, Demonstration, and Handouts Education comprehension: verbalized understanding and returned demonstration  HOME EXERCISE PROGRAM: Access Code: [email or web address] URL: [email or web address] Date: 03/01/2024 Prepared by: Loetta Ringer  Exercises - Sit to Stand with Resistance Around Legs  - 2 x daily - 7 x weekly - 2 sets - 10 reps - Seated Long Arc Quad  - 3 x daily - 7 x weekly - 2 sets - 10 reps - 5 hold - Standing Hip Abduction with Counter Support  - 3 x  daily - 7 x weekly - 2 sets - 10 reps - Seated Hamstring Stretch  - 2 x daily - 7 x weekly - 1 sets - 3 reps -  20 hold - Supine Bridge  - 1 x daily - 7 x weekly - 2 sets - 10 reps - Side Stepping with Counter Support  - 2 x daily - 7 x weekly - 2 sets - 10 reps  ASSESSMENT:  CLINICAL IMPRESSION: Pt had increased pain this week due to weather changes.  She has significant alignment challenges on the Lt due to OA and is working to strengthen the associated musculature. She had improved her technique with sit to stand and is able to maintain neutral hip position. Patient will benefit from skilled PT to address the below impairments and improve overall function.   OBJECTIVE IMPAIRMENTS: Abnormal gait, decreased activity tolerance, decreased balance, decreased endurance, decreased mobility, difficulty walking, decreased ROM, decreased strength, hypomobility, increased muscle spasms, impaired flexibility, postural dysfunction, and pain.   ACTIVITY LIMITATIONS: standing, squatting, sleeping, stairs, locomotion level, and caring for others  PARTICIPATION LIMITATIONS: cleaning, laundry, shopping, and community activity  PERSONAL FACTORS: Age, Time since onset of injury/illness/exacerbation, and 1 comorbidity: bil knee OA are also affecting patient's functional outcome.   REHAB POTENTIAL: Good  CLINICAL DECISION MAKING: Stable/uncomplicated  EVALUATION COMPLEXITY: Low   GOALS: Goals reviewed with patient? Yes  SHORT TERM GOALS: Target date: 03/01/2024   Be independent in initial HEP Baseline: Goal status: MET, 5/15  2.  Return to regular walking program and verbalize safe progression  Baseline: walking around the house (03/01/24) Goal status: ONGOING DISCUSSION 5/15  3.  Report > or = to 30% reduction in bil knee pain with standing and walking Baseline:  Goal status: INITIAL    LONG TERM GOALS: Target date: 03/29/2024    Be independent in advanced HEP Baseline:  Goal status:  INITIAL  2.  Improve LEFS to > or = to 56/80 Baseline: 45/80 Goal status: INITIAL  3.  Improve Lt LE to return to walking 3-4 miles with walking stick Baseline:  Goal status: INITIAL  4.  Improve 6 min walk test to > or = to 1300 to achieve age related norm Baseline:  Goal status: INITIAL  5.  Report > or = to 50% reduction in knee pain with standing and walking Baseline:  Goal status: INITIAL    PLAN:  PT FREQUENCY: 1-2x/week  PT DURATION: 8 weeks  PLANNED INTERVENTIONS: 97110-Therapeutic exercises, 97530- Therapeutic activity, 97112- Neuromuscular re-education, 97535- Self Care, 14782- Manual therapy, [phone]- Gait training, [phone]- Canalith repositioning, [id number]- Aquatic Therapy, [phone]- Electrical stimulation (unattended), [phone]- Electrical stimulation (manual), 97016- Vasopneumatic device, [id number]- Ionotophoresis 4mg /ml Dexamethasone, Patient/Family education, Balance training, Taping, Dry Needling, Joint mobilization, Joint manipulation, Vestibular training, Cryotherapy, and Moist heat  PLAN FOR NEXT SESSION: work on alignment and hip/knee strength, sit to stand, NuStep, 6 min walk test   Luella Sager, PT 03/01/24 11:02 AM    [facility] [address], [address] [place], [place] [address] Phone # [phone] Fax [phone]

## 2024-03-03 DIAGNOSIS — E782 Mixed hyperlipidemia: Secondary | ICD-10-CM | POA: Diagnosis not present

## 2024-03-03 DIAGNOSIS — I10 Essential (primary) hypertension: Secondary | ICD-10-CM | POA: Diagnosis not present

## 2024-03-08 ENCOUNTER — Ambulatory Visit: Attending: Family Medicine

## 2024-03-08 DIAGNOSIS — R2689 Other abnormalities of gait and mobility: Secondary | ICD-10-CM | POA: Diagnosis not present

## 2024-03-08 DIAGNOSIS — M25561 Pain in right knee: Secondary | ICD-10-CM | POA: Insufficient documentation

## 2024-03-08 DIAGNOSIS — M6281 Muscle weakness (generalized): Secondary | ICD-10-CM | POA: Insufficient documentation

## 2024-03-08 DIAGNOSIS — M25562 Pain in left knee: Secondary | ICD-10-CM | POA: Insufficient documentation

## 2024-03-08 DIAGNOSIS — G8929 Other chronic pain: Secondary | ICD-10-CM | POA: Diagnosis not present

## 2024-03-08 NOTE — Therapy (Signed)
 OUTPATIENT PHYSICAL THERAPY LOWER EXTREMITY TREATMENT   Patient Name: [name] MRN: [id number] DOB:[birth year], 83 y.o., female Today's Date: 03/08/2024  END OF SESSION:       Past Medical History:  Diagnosis Date   Atrophic vaginitis    Elevated cholesterol    Fibroid    Submucous myoma   Hypertension    Osteopenia 2011   T score -1.2 FRAX 7.6%/0.8%   Past Surgical History:  Procedure Laterality Date   CESAREAN SECTION     X2   HYSTEROSCOPY     Resect myoma   TUBAL LIGATION     Patient Active Problem List   Diagnosis Date Noted   Osteopenia    Fibroid    Atrophic vaginitis    Hypertension    Elevated cholesterol     PCP: Dyanna Glasgow   REFERRING PROVIDER: Perley Bradley, MD  REFERRING DIAG: M17.0 (ICD-10-CM) - Primary osteoarthritis of both knees   THERAPY DIAG:  Chronic pain of left knee  Other abnormalities of gait and mobility  Chronic pain of right knee  Muscle weakness (generalized)  Rationale for Evaluation and Treatment: Rehabilitation  ONSET DATE: chronic- stopped walking in Fall 2024  SUBJECTIVE:   SUBJECTIVE STATEMENT: 40% improvement overall.  I still need to watch my balance.    Eval: Pt presents to PT with bil knee pain and OA. 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  MD told her to stop walking for exercise as she might fall and break a hip.  She stopped walking in Fall 2024 and would like to get back to this.    PERTINENT HISTORY: Osteopenia T score -1.2  PAIN: 03/01/24 Are you having pain? Yes: NPRS scale: 6/10 Pain location: Lt >Rt knees- lateral  Pain description: shooting  Aggravating factors: walking, standing Relieving factors: ice, Advil  PRECAUTIONS: Fall  RED FLAGS: None   WEIGHT BEARING RESTRICTIONS: No  FALLS:  Has patient fallen in last 6 months? Yes. Number of falls 1- in bathroom putting on  pants  LIVING ENVIRONMENT: Lives with: lives with their spouse Lives in: House/apartment Stairs: Yes: Internal: 12 steps; on right going up Has following equipment at home: Walker - 4 wheeled and Grab bars  OCCUPATION: retired   PLOF: Independent and Leisure: walking for exercise-has not been doing this due to pain  PATIENT GOALS: return to walking, strengthen legs, reduce falls risk   OBJECTIVE:  Note: Objective measures were completed at Evaluation unless otherwise noted.  DIAGNOSTIC FINDINGS:  06/2023: X-ray: bil OA in knees   PATIENT SURVEYS:  02/02/24: LEFS 45/80=56% 6 min walk test: 1151 (age related norm is 1332 feet)  COGNITION: Overall cognitive status: Within functional limits for tasks assessed     SENSATION: WFL   MUSCLE LENGTH: Hamstring limited 25% bil  POSTURE: forward head and genu valgus Lt>Rt  PALPATION: Crepitus in bil knees, palpable tenderness over lateral knee joint  LOWER EXTREMITY ROM: Lacking 10 degrees of bil knee extension, full knee flexion   LOWER EXTREMITY MMT: Lt hip 4/5, knee 4/5, Lt hip 4+/5, knee 5/5  FUNCTIONAL TESTS:  5 times sit to stand: 11.11 with hip adduction  GAIT: Distance walked: 100 Assistive device utilized: None Level of assistance: Complete Independence Comments: genu valgus and trendelenburg  TREATMENT DATE:   03/08/24: NuStep L6 x 8' Pt present to discuss status-verbal cues for hip abduction Seated HS stretch bil 2x30" Seated bil clam green theraband 2x10 Lateral step up: 4" step 2x10 bil each  Sit to stand from chair with activation of hip abductors x10 Seated clam bil x10 and single clam x 5 each - red loop Seated hamstring curl: red loop 2x10 bil  Weight shifting at barre: on balance pad x1 min, 3 directions  Standing hip extension at barre 2x10 bil Sidestepping at barre: blue  loop around thighs Hurdles at barre: forward and lateral with step-to Rockerboard x3 min standing   03/01/24: NuStep L6 x 6' Pt present to discuss status Seated HS stretch bil 2x30" Seated bil clam green theraband 2x10 Sit to stand from chair with activation of hip abductors x10 Seated clam bil x10 and single clam x 5 each - red loop Seated hamstring curl: green theraband 2x10 bil  Weight shifting at barre: on balance pad x1 min, 3 directions  Standing hip extension at barre 2x10 bil Sidestepping at barre  Rockerboard x3 min standing   02/23/24: NuStep L3 x 6' Pt present to discuss status Seated HS stretch bil 2x30" Seated bil clam green theraband 2x10 Sit to stand from chair with green band around thighs x10- did with counter top next to her to improve safety and feeling of stability Seated clam bil x10 and single clam x 5 each - red loop Seated hamstring curl: green theraband 2x10 bil  Weight shifting at barre:  Standing hip abd at barre 2x10 bil  PATIENT EDUCATION:  Education details: Access Code: [email or web address] Person educated: Patient Education method: Explanation, Demonstration, and Handouts Education comprehension: verbalized understanding and returned demonstration  HOME EXERCISE PROGRAM: Access Code: [email or web address] URL: [email or web address] Date: 03/01/2024 Prepared by: Loetta Ringer  Exercises - Sit to Stand with Resistance Around Legs  - 2 x daily - 7 x weekly - 2 sets - 10 reps - Seated Long Arc Quad  - 3 x daily - 7 x weekly - 2 sets - 10 reps - 5 hold - Standing Hip Abduction with Counter Support  - 3 x daily - 7 x weekly - 2 sets - 10 reps - Seated Hamstring Stretch  - 2 x daily - 7 x weekly - 1 sets - 3 reps - 20 hold - Supine Bridge  - 1 x daily - 7 x weekly - 2 sets - 10 reps - Side Stepping with Counter Support  - 2 x daily - 7 x weekly - 2 sets - 10 reps  ASSESSMENT:  CLINICAL IMPRESSION: Pt reports 40% overall improvement in symptoms since the start of  care.  She has significant alignment challenges on the Lt due to OA and is working to strengthen the associated musculature. She continues with improved her technique with sit to stand and is able to maintain neutral hip position. She has not been able to walk outside the house due to care of her husband.  PT monitored for technique and pain throughout session. Patient will benefit from skilled PT to address the below impairments and improve overall function.   OBJECTIVE IMPAIRMENTS: Abnormal gait, decreased activity tolerance, decreased balance, decreased endurance, decreased mobility, difficulty walking, decreased ROM, decreased strength, hypomobility, increased muscle spasms, impaired flexibility, postural dysfunction, and pain.   ACTIVITY LIMITATIONS: standing, squatting, sleeping, stairs, locomotion level, and caring for others  PARTICIPATION LIMITATIONS: cleaning, laundry, shopping, and community activity  PERSONAL FACTORS: Age, Time since onset of  injury/illness/exacerbation, and 1 comorbidity: bil knee OA are also affecting patient's functional outcome.   REHAB POTENTIAL: Good  CLINICAL DECISION MAKING: Stable/uncomplicated  EVALUATION COMPLEXITY: Low   GOALS: Goals reviewed with patient? Yes  SHORT TERM GOALS: Target date: 03/01/2024   Be independent in initial HEP Baseline: Goal status: MET, 5/15  2.  Return to regular walking program and verbalize safe progression  Baseline: walking around the house (03/01/24) Goal status: ONGOING DISCUSSION 5/15  3.  Report > or = to 30% reduction in bil knee pain with standing and walking Baseline: 40% (03/08/24) Goal status: MET    LONG TERM GOALS: Target date: 03/29/2024    Be independent in advanced HEP Baseline:  Goal status: INITIAL  2.  Improve LEFS to > or = to 56/80 Baseline: 45/80 Goal status: INITIAL  3.  Improve Lt LE to return to walking 3-4 miles with walking stick Baseline:  Goal status: INITIAL  4.  Improve 6  min walk test to > or = to 1300 to achieve age related norm Baseline:  Goal status: INITIAL  5.  Report > or = to 50% reduction in knee pain with standing and walking Baseline: 40% (03/08/24) Goal status: In progress     PLAN:  PT FREQUENCY: 1-2x/week  PT DURATION: 8 weeks  PLANNED INTERVENTIONS: 97110-Therapeutic exercises, 97530- Therapeutic activity, 97112- Neuromuscular re-education, [phone]- Self Care, 60454- Manual therapy, [phone]- Gait training, [phone]- Canalith repositioning, [id number]- Aquatic Therapy, [phone]- Electrical stimulation (unattended), [phone]- Electrical stimulation (manual), 97016- Vasopneumatic device, 97033- Ionotophoresis 4mg /ml Dexamethasone, Patient/Family education, Balance training, Taping, Dry Needling, Joint mobilization, Joint manipulation, Vestibular training, Cryotherapy, and Moist heat  PLAN FOR NEXT SESSION: work on alignment and hip/knee strength, sit to stand, NuStep, 6 min walk test   Luella Sager, PT 03/08/24 10:24 AM    [facility] [address], [address] [place], [place] [address] Phone # [phone] Fax [phone]

## 2024-03-15 ENCOUNTER — Ambulatory Visit

## 2024-03-15 DIAGNOSIS — R2689 Other abnormalities of gait and mobility: Secondary | ICD-10-CM | POA: Diagnosis not present

## 2024-03-15 DIAGNOSIS — M25561 Pain in right knee: Secondary | ICD-10-CM | POA: Diagnosis not present

## 2024-03-15 DIAGNOSIS — M6281 Muscle weakness (generalized): Secondary | ICD-10-CM | POA: Diagnosis not present

## 2024-03-15 DIAGNOSIS — G8929 Other chronic pain: Secondary | ICD-10-CM | POA: Diagnosis not present

## 2024-03-15 DIAGNOSIS — M25562 Pain in left knee: Secondary | ICD-10-CM | POA: Diagnosis not present

## 2024-03-15 NOTE — Therapy (Signed)
 OUTPATIENT PHYSICAL THERAPY LOWER EXTREMITY TREATMENT   Patient Name: [name] MRN: [id number] DOB:05-20-1941, 83 y.o., female Today's Date: 03/15/2024  END OF SESSION:  PT End of Session - 03/15/24 1014     Visit Number 6    Date for PT Re-Evaluation 03/29/24    Authorization Type [employer]    Progress Note Due on Visit 10    PT Start Time 0932    PT Stop Time 1015    PT Time Calculation (min) 43 min    Activity Tolerance Patient tolerated treatment well    Behavior During Therapy WFL for tasks assessed/performed              Past Medical History:  Diagnosis Date   Atrophic vaginitis    Elevated cholesterol    Fibroid    Submucous myoma   Hypertension    Osteopenia 2011   T score -1.2 FRAX 7.6%/0.8%   Past Surgical History:  Procedure Laterality Date   CESAREAN SECTION     X2   HYSTEROSCOPY     Resect myoma   TUBAL LIGATION     Patient Active Problem List   Diagnosis Date Noted   Osteopenia    Fibroid    Atrophic vaginitis    Hypertension    Elevated cholesterol     PCP: Dyanna Glasgow   REFERRING PROVIDER: Perley Bradley, MD  REFERRING DIAG: M17.0 (ICD-10-CM) - Primary osteoarthritis of both knees   THERAPY DIAG:  Chronic pain of left knee  Other abnormalities of gait and mobility  Chronic pain of right knee  Muscle weakness (generalized)  Rationale for Evaluation and Treatment: Rehabilitation  ONSET DATE: chronic- stopped walking in Fall 2024  SUBJECTIVE:   SUBJECTIVE STATEMENT: I'm stiff.  I'm doing ok.    Eval: Pt presents to PT with bil knee pain and OA. 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  MD told her to stop walking for exercise as she might fall and break a hip.  She stopped walking in Fall 2024 and would like to get back to this.    PERTINENT HISTORY: Osteopenia T score -1.2  PAIN: 03/15/24 Are you having pain?  Yes: NPRS scale: 3-5/10 Pain location: Lt >Rt knees- lateral  Pain description: shooting  Aggravating factors: walking, standing Relieving factors: ice, Advil  PRECAUTIONS: Fall  RED FLAGS: None   WEIGHT BEARING RESTRICTIONS: No  FALLS:  Has patient fallen in last 6 months? Yes. Number of falls 1- in bathroom putting on pants  LIVING ENVIRONMENT: Lives with: lives with their spouse Lives in: House/apartment Stairs: Yes: Internal: 12 steps; on right going up Has following equipment at home: Walker - 4 wheeled and Grab bars  OCCUPATION: retired   PLOF: Independent and Leisure: walking for exercise-has not been doing this due to pain  PATIENT GOALS: return to walking, strengthen legs, reduce falls risk   OBJECTIVE:  Note: Objective measures were completed at Evaluation unless otherwise noted.  DIAGNOSTIC FINDINGS:  06/2023: X-ray: bil OA in knees   PATIENT SURVEYS:  02/02/24: LEFS 45/80=56% 6 min walk test: 1151 (age related norm is 1332 feet) 03/15/24: 1207 feet   COGNITION: Overall cognitive status: Within functional limits for tasks assessed     SENSATION: WFL   MUSCLE LENGTH: Hamstring limited 25% bil  POSTURE: forward head and genu valgus Lt>Rt  PALPATION: Crepitus in bil knees, palpable tenderness  over lateral knee joint  LOWER EXTREMITY ROM: Lacking 10 degrees of bil knee extension, full knee flexion   LOWER EXTREMITY MMT: Lt hip 4/5, knee 4/5, Lt hip 4+/5, knee 5/5  FUNCTIONAL TESTS:  5 times sit to stand: 11.11 with hip adduction  GAIT: Distance walked: 100 Assistive device utilized: None Level of assistance: Complete Independence Comments: genu valgus and trendelenburg                                                                                                                                TREATMENT DATE:    03/15/24: 6 min walk test: 1207 feet NuStep L6 x 8' Pt present to discuss status-verbal cues for hip abduction Seated HS stretch  bil 2x30 Forward step up: 4 step 2x10 bil each  Sit to stand from chair with activation of hip abductors x10 Seated hamstring curl: red loop 2x10 bil  Weight shifting at barre: on balance pad x1 min, 3 directions  Standing hip extension at barre on balance pad 2x10 bil Hurdles at barre: forward and lateral with step-to Rockerboard x3 min standing   03/08/24: NuStep L6 x 8' Pt present to discuss status-verbal cues for hip abduction Seated HS stretch bil 2x30 Seated bil clam green theraband 2x10 Lateral step up: 4 step 2x10 bil each  Sit to stand from chair with activation of hip abductors x10 Seated clam bil x10 and single clam x 5 each - red loop Seated hamstring curl: red loop 2x10 bil  Weight shifting at barre: on balance pad x1 min, 3 directions  Standing hip extension at barre 2x10 bil Sidestepping at barre: blue loop around thighs Hurdles at barre: forward and lateral with step-to Rockerboard x3 min standing   03/01/24: NuStep L6 x 6' Pt present to discuss status Seated HS stretch bil 2x30 Seated bil clam green theraband 2x10 Sit to stand from chair with activation of hip abductors x10 Seated clam bil x10 and single clam x 5 each - red loop Seated hamstring curl: green theraband 2x10 bil  Weight shifting at barre: on balance pad x1 min, 3 directions  Standing hip extension at barre 2x10 bil Sidestepping at barre  Rockerboard x3 min standing   PATIENT EDUCATION:  Education details: Access Code: [email or web address] Person educated: Patient Education method: Explanation, Demonstration, and Handouts Education comprehension: verbalized understanding and returned demonstration  HOME EXERCISE PROGRAM: Access Code: [email or web address] URL: [email or web address] Date: 03/01/2024 Prepared by: Loetta Ringer  Exercises - Sit to Stand with Resistance Around Legs  - 2 x daily - 7 x weekly - 2 sets - 10 reps - Seated Long Arc Quad  - 3 x daily - 7 x weekly - 2 sets - 10 reps - 5 hold -  Standing Hip Abduction with Counter Support  - 3 x daily - 7 x weekly - 2 sets - 10 reps - Seated Hamstring Stretch  - 2 x daily - 7 x weekly - 1 sets -  3 reps - 20 hold - Supine Bridge  - 1 x daily - 7 x weekly - 2 sets - 10 reps - Side Stepping with Counter Support  - 2 x daily - 7 x weekly - 2 sets - 10 reps  ASSESSMENT:  CLINICAL IMPRESSION: Pt reports 40% overall improvement in symptoms since the start of care.  She has significant alignment challenges on the Lt due to OA and is working to strengthen the associated musculature.6 min walk is improved to 1207 feet.  She has not been able to walk outside the house due to care of her husband and she has been walking inside the house.  PT monitored for technique and pain throughout session. Patient will benefit from skilled PT to address the below impairments and improve overall function.   OBJECTIVE IMPAIRMENTS: Abnormal gait, decreased activity tolerance, decreased balance, decreased endurance, decreased mobility, difficulty walking, decreased ROM, decreased strength, hypomobility, increased muscle spasms, impaired flexibility, postural dysfunction, and pain.   ACTIVITY LIMITATIONS: standing, squatting, sleeping, stairs, locomotion level, and caring for others  PARTICIPATION LIMITATIONS: cleaning, laundry, shopping, and community activity  PERSONAL FACTORS: Age, Time since onset of injury/illness/exacerbation, and 1 comorbidity: bil knee OA are also affecting patient's functional outcome.   REHAB POTENTIAL: Good  CLINICAL DECISION MAKING: Stable/uncomplicated  EVALUATION COMPLEXITY: Low   GOALS: Goals reviewed with patient? Yes  SHORT TERM GOALS: Target date: 03/01/2024   Be independent in initial HEP Baseline: Goal status: MET, 5/15  2.  Return to regular walking program and verbalize safe progression  Baseline: walking around the house (03/01/24) Goal status: ONGOING DISCUSSION 5/15  3.  Report > or = to 30% reduction in bil  knee pain with standing and walking Baseline: 40% (03/08/24) Goal status: MET    LONG TERM GOALS: Target date: 03/29/2024    Be independent in advanced HEP Baseline:  Goal status: INITIAL  2.  Improve LEFS to > or = to 56/80 Baseline: 45/80 Goal status: INITIAL  3.  Improve Lt LE to return to walking 3-4 miles with walking stick Baseline: walking inside the house (03/15/24) Goal status: INITIAL  4.  Improve 6 min walk test to > or = to 1300 to achieve age related norm Baseline: 1207 feet (03/15/24) Goal status: In progress   5.  Report > or = to 50% reduction in knee pain with standing and walking Baseline: 40% (03/08/24) Goal status: In progress     PLAN:  PT FREQUENCY: 1-2x/week  PT DURATION: 8 weeks  PLANNED INTERVENTIONS: 97110-Therapeutic exercises, 97530- Therapeutic activity, 97112- Neuromuscular re-education, [phone]- Self Care, 62130- Manual therapy, [phone]- Gait training, [phone]- Canalith repositioning, [id number]- Aquatic Therapy, [phone]- Electrical stimulation (unattended), [phone]- Electrical stimulation (manual), 97016- Vasopneumatic device, [id number]- Ionotophoresis 4mg /ml Dexamethasone, Patient/Family education, Balance training, Taping, Dry Needling, Joint mobilization, Joint manipulation, Vestibular training, Cryotherapy, and Moist heat  PLAN FOR NEXT SESSION: work on alignment and hip/knee strength, sit to stand, [employer], PT 03/15/24 10:15 AM    [facility] [address], [address] [place], [place] [address] Phone # [phone] Fax [phone]

## 2024-03-21 ENCOUNTER — Ambulatory Visit

## 2024-03-21 DIAGNOSIS — M25561 Pain in right knee: Secondary | ICD-10-CM | POA: Diagnosis not present

## 2024-03-21 DIAGNOSIS — M25562 Pain in left knee: Secondary | ICD-10-CM | POA: Diagnosis not present

## 2024-03-21 DIAGNOSIS — M6281 Muscle weakness (generalized): Secondary | ICD-10-CM | POA: Diagnosis not present

## 2024-03-21 DIAGNOSIS — R2689 Other abnormalities of gait and mobility: Secondary | ICD-10-CM | POA: Diagnosis not present

## 2024-03-21 DIAGNOSIS — G8929 Other chronic pain: Secondary | ICD-10-CM | POA: Diagnosis not present

## 2024-03-21 NOTE — Therapy (Signed)
 OUTPATIENT PHYSICAL THERAPY LOWER EXTREMITY TREATMENT   Patient Name: [name] MRN: [id number] DOB:June 15, 1941, 83 y.o., female Today's Date: 03/21/2024  END OF SESSION:  PT End of Session - 03/21/24 1024     Visit Number 7    Date for PT Re-Evaluation 05/18/24    Authorization Type [employer]    Progress Note Due on Visit 10    PT Start Time 0931    PT Stop Time 1016    PT Time Calculation (min) 45 min    Activity Tolerance Patient tolerated treatment well    Behavior During Therapy WFL for tasks assessed/performed               Past Medical History:  Diagnosis Date   Atrophic vaginitis    Elevated cholesterol    Fibroid    Submucous myoma   Hypertension    Osteopenia 2011   T score -1.2 FRAX 7.6%/0.8%   Past Surgical History:  Procedure Laterality Date   CESAREAN SECTION     X2   HYSTEROSCOPY     Resect myoma   TUBAL LIGATION     Patient Active Problem List   Diagnosis Date Noted   Osteopenia    Fibroid    Atrophic vaginitis    Hypertension    Elevated cholesterol     PCP: Dyanna Glasgow   REFERRING PROVIDER: Perley Bradley, MD  REFERRING DIAG: M17.0 (ICD-10-CM) - Primary osteoarthritis of both knees   THERAPY DIAG:  Chronic pain of left knee - Plan: PT plan of care cert/re-cert  Other abnormalities of gait and mobility - Plan: PT plan of care cert/re-cert  Chronic pain of right knee - Plan: PT plan of care cert/re-cert  Rationale for Evaluation and Treatment: Rehabilitation  ONSET DATE: chronic- stopped walking in Fall 2024  SUBJECTIVE:   SUBJECTIVE STATEMENT: I'm stiff.  I'm doing ok.    Eval: Pt presents to PT with bil knee pain and OA. 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  MD told her to stop walking for exercise as she might fall and break a hip.  She stopped walking in Fall 2024 and would like to get back to this.     PERTINENT HISTORY: Osteopenia T score -1.2  PAIN: 03/15/24 Are you having pain? Yes: NPRS scale: 3-5/10 Pain location: Lt >Rt knees- lateral  Pain description: shooting  Aggravating factors: walking, standing Relieving factors: ice, Advil  PRECAUTIONS: Fall  RED FLAGS: None   WEIGHT BEARING RESTRICTIONS: No  FALLS:  Has patient fallen in last 6 months? Yes. Number of falls 1- in bathroom putting on pants  LIVING ENVIRONMENT: Lives with: lives with their spouse Lives in: House/apartment Stairs: Yes: Internal: 12 steps; on right going up Has following equipment at home: Walker - 4 wheeled and Grab bars  OCCUPATION: retired   PLOF: Independent and Leisure: walking for exercise-has not been doing this due to pain  PATIENT GOALS: return to walking, strengthen legs, reduce falls risk   OBJECTIVE:  Note: Objective measures were completed at Evaluation unless otherwise noted.  DIAGNOSTIC FINDINGS:  06/2023: X-ray: bil OA in knees   PATIENT SURVEYS:  02/02/24: LEFS 45/80=56% 6 min walk test: 1151 (age related norm is 1332 feet) 03/15/24: 1207 feet   COGNITION: Overall cognitive status: Within functional limits for tasks assessed     SENSATION: WFL   MUSCLE LENGTH: Hamstring limited  25% bil  POSTURE: forward head and genu valgus Lt>Rt  PALPATION: Crepitus in bil knees, palpable tenderness over lateral knee joint  LOWER EXTREMITY ROM: Lacking 10 degrees of bil knee extension, full knee flexion   LOWER EXTREMITY MMT: Lt hip 4/5, knee 4/5, Lt hip 4+/5, knee 5/5  FUNCTIONAL TESTS:  5 times sit to stand: 11.11 with hip adduction  GAIT: Distance walked: 100 Assistive device utilized: None Level of assistance: Complete Independence Comments: genu valgus and trendelenburg                                                                                                                                TREATMENT DATE:   03/21/24: NuStep L6 x 8' Pt present to discuss  status-verbal cues for hip abduction Seated HS stretch bil 2x30  Step up on to foam pad with stabilization x10 Rt and Lt Sit to stand from chair with activation of hip abductors x10 Seated hamstring curl: green band 2x10 bil  Weight shifting at barre: on balance pad x1 min, 3 directions  Standing hip extension and abduction at barre on balance pad 2x10 bil Hurdles at barre: forward and lateral with step-to Rockerboard x3 min standing   03/15/24: 6 min walk test: 1207 feet NuStep L6 x 8' Pt present to discuss status-verbal cues for hip abduction Seated HS stretch bil 2x30 Forward step up: 4 step 2x10 bil each  Sit to stand from chair with activation of hip abductors x10 Seated hamstring curl: red loop 2x10 bil  Weight shifting at barre: on balance pad x1 min, 3 directions  Standing hip extension at barre on balance pad 2x10 bil Hurdles at barre: forward and lateral with step-to Rockerboard x3 min standing   03/08/24: NuStep L6 x 8' Pt present to discuss status-verbal cues for hip abduction Seated HS stretch bil 2x30 Seated bil clam green theraband 2x10 Lateral step up: 4 step 2x10 bil each  Sit to stand from chair with activation of hip abductors x10 Seated clam bil x10 and single clam x 5 each - red loop Seated hamstring curl: red loop 2x10 bil  Weight shifting at barre: on balance pad x1 min, 3 directions  Standing hip extension at barre 2x10 bil Sidestepping at barre: blue loop around thighs Hurdles at barre: forward and lateral with step-to Rockerboard x3 min standing    PATIENT EDUCATION:  Education details: Access Code: [email or web address] Person educated: Patient Education method: Explanation, Demonstration, and Handouts Education comprehension: verbalized understanding and returned demonstration  HOME EXERCISE PROGRAM: Access Code: [email or web address] URL: [email or web address] Date: 03/01/2024 Prepared by: Loetta Ringer  Exercises - Sit to Stand with Resistance Around Legs   - 2 x daily - 7 x weekly - 2 sets - 10 reps - Seated Long Arc Quad  - 3 x daily - 7 x weekly - 2 sets - 10 reps - 5 hold - Standing Hip Abduction with Counter Support  - 3 x daily -  7 x weekly - 2 sets - 10 reps - Seated Hamstring Stretch  - 2 x daily - 7 x weekly - 1 sets - 3 reps - 20 hold - Supine Bridge  - 1 x daily - 7 x weekly - 2 sets - 10 reps - Side Stepping with Counter Support  - 2 x daily - 7 x weekly - 2 sets - 10 reps  ASSESSMENT:  CLINICAL IMPRESSION: Pt reports only stiffness in the morning today.  He has been working at home on strength and stability. She reports that she is 70% better regarding pain.  She did well with stabilization exercises today and worked to activate glute med to support Lt knee valgus.   PT monitored for technique and pain throughout session. Patient will benefit from skilled PT to improve functional strength and endurance and to address the below impairments and improve overall function.   OBJECTIVE IMPAIRMENTS: Abnormal gait, decreased activity tolerance, decreased balance, decreased endurance, decreased mobility, difficulty walking, decreased ROM, decreased strength, hypomobility, increased muscle spasms, impaired flexibility, postural dysfunction, and pain.   ACTIVITY LIMITATIONS: standing, squatting, sleeping, stairs, locomotion level, and caring for others  PARTICIPATION LIMITATIONS: cleaning, laundry, shopping, and community activity  PERSONAL FACTORS: Age, Time since onset of injury/illness/exacerbation, and 1 comorbidity: bil knee OA are also affecting patient's functional outcome.   REHAB POTENTIAL: Good  CLINICAL DECISION MAKING: Stable/uncomplicated  EVALUATION COMPLEXITY: Low   GOALS: Goals reviewed with patient? Yes  SHORT TERM GOALS: Target date: 03/01/2024   Be independent in initial HEP Baseline: Goal status: MET, 5/15  2.  Return to regular walking program and verbalize safe progression  Baseline: walking around the house  (03/01/24) Goal status: ONGOING DISCUSSION 5/15  3.  Report > or = to 30% reduction in bil knee pain with standing and walking Baseline: 40% (03/08/24) Goal status: MET    LONG TERM GOALS: Target date: 05/18/24    Be independent in advanced HEP Baseline: independent in current program (03/21/24) Goal status: in progress   2.  Improve LEFS to > or = to 56/80 Baseline: 45/80 Goal status: INITIAL  3.  Improve Lt LE to return to walking 3-4 miles with walking stick Baseline: walking inside the house (03/15/24) Goal status: in progress   4.  Improve 6 min walk test to > or = to 1300 to achieve age related norm Baseline: 1207 feet (03/15/24) Goal status: In progress   5.  Report > or = to 850% reduction in knee pain with standing and walking Baseline: 70% (03/21/24) Goal status: In progress     PLAN:  PT FREQUENCY: 1x/week  PT DURATION: 8 weeks  PLANNED INTERVENTIONS: 97110-Therapeutic exercises, 97530- Therapeutic activity, [id number]- Neuromuscular re-education, 97535- Self Care, 16109- Manual therapy, [id number]- Gait training, [phone]- Canalith repositioning, [id number]- Aquatic Therapy, [phone]- Electrical stimulation (unattended), [phone]- Electrical stimulation (manual), 97016- Vasopneumatic device, [id number]- Ionotophoresis 4mg /ml Dexamethasone, Patient/Family education, Balance training, Taping, Dry Needling, Joint mobilization, Joint manipulation, Vestibular training, Cryotherapy, and Moist heat  PLAN FOR NEXT SESSION: work on alignment and hip/knee strength, sit to stand, NuStep.  Pt will reduce to every other week after 04/05/24   Luella Sager, PT 03/21/24 10:26 AM    [facility] [address], [address] [place], [place] [address] Phone # [phone] Fax [phone]

## 2024-03-26 ENCOUNTER — Encounter: Admitting: Physical Therapy

## 2024-04-02 DIAGNOSIS — I10 Essential (primary) hypertension: Secondary | ICD-10-CM | POA: Diagnosis not present

## 2024-04-02 DIAGNOSIS — E782 Mixed hyperlipidemia: Secondary | ICD-10-CM | POA: Diagnosis not present

## 2024-04-05 ENCOUNTER — Ambulatory Visit

## 2024-04-19 ENCOUNTER — Ambulatory Visit: Attending: Family Medicine

## 2024-04-19 DIAGNOSIS — M25562 Pain in left knee: Secondary | ICD-10-CM | POA: Insufficient documentation

## 2024-04-19 DIAGNOSIS — M6281 Muscle weakness (generalized): Secondary | ICD-10-CM | POA: Insufficient documentation

## 2024-04-19 DIAGNOSIS — M25561 Pain in right knee: Secondary | ICD-10-CM | POA: Diagnosis not present

## 2024-04-19 DIAGNOSIS — G8929 Other chronic pain: Secondary | ICD-10-CM | POA: Diagnosis not present

## 2024-04-19 DIAGNOSIS — R2689 Other abnormalities of gait and mobility: Secondary | ICD-10-CM | POA: Insufficient documentation

## 2024-04-19 NOTE — Therapy (Signed)
 OUTPATIENT PHYSICAL THERAPY LOWER EXTREMITY TREATMENT   Patient Name: [name] MRN: [id number] DOB:[birth year], 83 y.o., female Today's Date: 04/19/2024  END OF SESSION:  PT End of Session - 04/19/24 1143     Visit Number 8    Date for PT Re-Evaluation 05/18/24    Authorization Type [employer]    Progress Note Due on Visit 10    PT Start Time 1100    PT Stop Time 1143    PT Time Calculation (min) 43 min    Activity Tolerance Patient tolerated treatment well    Behavior During Therapy WFL for tasks assessed/performed                Past Medical History:  Diagnosis Date   Atrophic vaginitis    Elevated cholesterol    Fibroid    Submucous myoma   Hypertension    Osteopenia 2011   T score -1.2 FRAX 7.6%/0.8%   Past Surgical History:  Procedure Laterality Date   CESAREAN SECTION     X2   HYSTEROSCOPY     Resect myoma   TUBAL LIGATION     Patient Active Problem List   Diagnosis Date Noted   Osteopenia    Fibroid    Atrophic vaginitis    Hypertension    Elevated cholesterol     PCP: Dannielle Bouchard   REFERRING PROVIDER: Cleotilde Planas, MD  REFERRING DIAG: M17.0 (ICD-10-CM) - Primary osteoarthritis of both knees   THERAPY DIAG:  Chronic pain of left knee  Other abnormalities of gait and mobility  Chronic pain of right knee  Muscle weakness (generalized)  Rationale for Evaluation and Treatment: Rehabilitation  ONSET DATE: chronic- stopped walking in Fall 2024  SUBJECTIVE:   SUBJECTIVE STATEMENT: Lapse in treatment x 1 month.  I am going to see Dr Heide on 05/11/24. My Lt knee is hurting from my knee to my toes.     Eval: Pt presents to PT with bil knee pain and OA. 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  MD told her to stop walking for exercise as she might fall and break a hip.  She stopped walking in Fall 2024 and would like to get back  to this.    PERTINENT HISTORY: Osteopenia T score -1.2  PAIN: 04/19/24 Are you having pain? Yes: NPRS scale: 5/10 Pain location: Lt >Rt knees- lateral  Pain description: shooting  Aggravating factors: walking, standing Relieving factors: ice, Advil  PRECAUTIONS: Fall  RED FLAGS: None   WEIGHT BEARING RESTRICTIONS: No  FALLS:  Has patient fallen in last 6 months? Yes. Number of falls 1- in bathroom putting on pants  LIVING ENVIRONMENT: Lives with: lives with their spouse Lives in: House/apartment Stairs: Yes: Internal: 12 steps; on right going up Has following equipment at home: Walker - 4 wheeled and Grab bars  OCCUPATION: retired   PLOF: Independent and Leisure: walking for exercise-has not been doing this due to pain  PATIENT GOALS: return to walking, strengthen legs, reduce falls risk   OBJECTIVE:  Note: Objective measures were completed at Evaluation unless otherwise noted.  DIAGNOSTIC FINDINGS:  06/2023: X-ray: bil OA in knees   PATIENT SURVEYS:  02/02/24: LEFS 45/80=56% 6 min walk test: 1151 (age related norm is 1332 feet) 03/15/24: 1207 feet   COGNITION: Overall cognitive status: Within functional limits for tasks assessed     SENSATION: [facility]  MUSCLE LENGTH: Hamstring limited 25% bil  POSTURE: forward head and genu valgus Lt>Rt  PALPATION: Crepitus in bil knees, palpable tenderness over lateral knee joint  LOWER EXTREMITY ROM: Lacking 10 degrees of bil knee extension, full knee flexion   LOWER EXTREMITY MMT: Lt hip 4/5, knee 4/5, Lt hip 4+/5, knee 5/5  FUNCTIONAL TESTS:  5 times sit to stand: 11.11 with hip adduction  GAIT: Distance walked: 100 Assistive device utilized: None Level of assistance: Complete Independence Comments: genu valgus and trendelenburg                                                                                                                                TREATMENT DATE:    04/19/24: NuStep L6 x 8' Pt present  to discuss status-verbal cues for hip abduction Seated HS stretch bil 2x30  Step up on to foam pad with stabilization x10 Rt and Lt Sit to stand from chair with activation of hip abductors x10 Seated hamstring curl: green band 2x10 bil  Standing hip extension and abduction at barre on balance pad 2x10 bil Hurdles at barre: forward and lateral with step-to Rockerboard x3 min standing   03/21/24: NuStep L6 x 8' Pt present to discuss status-verbal cues for hip abduction Seated HS stretch bil 2x30  Step up on to foam pad with stabilization x10 Rt and Lt Sit to stand from chair with activation of hip abductors x10 Seated hamstring curl: green band 2x10 bil  Weight shifting at barre: on balance pad x1 min, 3 directions  Standing hip extension and abduction at barre on balance pad 2x10 bil Hurdles at barre: forward and lateral with step-to Rockerboard x3 min standing   03/15/24: 6 min walk test: 1207 feet NuStep L6 x 8' Pt present to discuss status-verbal cues for hip abduction Seated HS stretch bil 2x30 Forward step up: 4 step 2x10 bil each  Sit to stand from chair with activation of hip abductors x10 Seated hamstring curl: red loop 2x10 bil  Weight shifting at barre: on balance pad x1 min, 3 directions  Standing hip extension at barre on balance pad 2x10 bil Hurdles at barre: forward and lateral with step-to Rockerboard x3 min standing    PATIENT EDUCATION:  Education details: Access Code: [email or web address] Person educated: Patient Education method: Explanation, Demonstration, and Handouts Education comprehension: verbalized understanding and returned demonstration  HOME EXERCISE PROGRAM: Access Code: [email or web address] URL: [email or web address] Date: 03/01/2024 Prepared by: Burnard  Exercises - Sit to Stand with Resistance Around Legs  - 2 x daily - 7 x weekly - 2 sets - 10 reps - Seated Long Arc Quad  - 3 x daily - 7 x weekly - 2 sets - 10 reps - 5 hold - Standing Hip  Abduction with Counter Support  - 3 x daily - 7 x weekly - 2 sets - 10 reps - Seated Hamstring Stretch  - 2 x daily - 7 x weekly - 1  sets - 3 reps - 20 hold - Supine Bridge  - 1 x daily - 7 x weekly - 2 sets - 10 reps - Side Stepping with Counter Support  - 2 x daily - 7 x weekly - 2 sets - 10 reps  ASSESSMENT:  CLINICAL IMPRESSION: Lapse in treatment x 1 month.  Pt had to cancel appt due to conflict.  She reports increased Lt LE pain and non compliance with her HEP due to caring for her husband.  PT emphasized the importance of compliance with HEP.  She did well with stabilization exercises today and is working  to activate glute med to support Lt knee valgus.   PT monitored for technique and pain throughout session. Patient will benefit from skilled PT to improve functional strength and endurance and to address the below impairments and improve overall function.   OBJECTIVE IMPAIRMENTS: Abnormal gait, decreased activity tolerance, decreased balance, decreased endurance, decreased mobility, difficulty walking, decreased ROM, decreased strength, hypomobility, increased muscle spasms, impaired flexibility, postural dysfunction, and pain.   ACTIVITY LIMITATIONS: standing, squatting, sleeping, stairs, locomotion level, and caring for others  PARTICIPATION LIMITATIONS: cleaning, laundry, shopping, and community activity  PERSONAL FACTORS: Age, Time since onset of injury/illness/exacerbation, and 1 comorbidity: bil knee OA are also affecting patient's functional outcome.   REHAB POTENTIAL: Good  CLINICAL DECISION MAKING: Stable/uncomplicated  EVALUATION COMPLEXITY: Low   GOALS: Goals reviewed with patient? Yes  SHORT TERM GOALS: Target date: 03/01/2024   Be independent in initial HEP Baseline: Goal status: MET, 5/15  2.  Return to regular walking program and verbalize safe progression  Baseline: walking around the house (03/01/24) Goal status: ONGOING DISCUSSION 5/15  3.  Report > or =  to 30% reduction in bil knee pain with standing and walking Baseline: 40% (03/08/24) Goal status: MET    LONG TERM GOALS: Target date: 05/18/24    Be independent in advanced HEP Baseline: independent in current program (03/21/24) Goal status: in progress   2.  Improve LEFS to > or = to 56/80 Baseline: 45/80 Goal status: INITIAL  3.  Improve Lt LE to return to walking 3-4 miles with walking stick Baseline: walking inside the house (03/15/24) Goal status: in progress   4.  Improve 6 min walk test to > or = to 1300 to achieve age related norm Baseline: 1207 feet (03/15/24) Goal status: In progress   5.  Report > or = to 85% reduction in knee pain with standing and walking Baseline: 70% (03/21/24) Goal status: In progress     PLAN:  PT FREQUENCY: 1x/week  PT DURATION: 8 weeks  PLANNED INTERVENTIONS: 97110-Therapeutic exercises, 97530- Therapeutic activity, [id number]- Neuromuscular re-education, 97535- Self Care, [id number]- Manual therapy, [id number]- Gait training, [phone]- Canalith repositioning, [id number]- Aquatic Therapy, [id number]- Electrical stimulation (unattended), [phone]- Electrical stimulation (manual), [id number]- Vasopneumatic device, [id number]- Ionotophoresis 4mg /ml Dexamethasone, Patient/Family education, Balance training, Taping, Dry Needling, Joint mobilization, Joint manipulation, Vestibular training, Cryotherapy, and Moist heat  PLAN FOR NEXT SESSION: work on alignment and hip/knee strength, sit to stand, NuStep.     Burnard Joy, PT 04/19/24 11:58 AM    [facility] [address] [place], [place] [address] Phone # [phone] Fax [phone]

## 2024-04-24 DIAGNOSIS — E782 Mixed hyperlipidemia: Secondary | ICD-10-CM | POA: Diagnosis not present

## 2024-05-03 ENCOUNTER — Ambulatory Visit

## 2024-05-03 DIAGNOSIS — M25562 Pain in left knee: Secondary | ICD-10-CM | POA: Diagnosis not present

## 2024-05-03 DIAGNOSIS — M6281 Muscle weakness (generalized): Secondary | ICD-10-CM | POA: Diagnosis not present

## 2024-05-03 DIAGNOSIS — R2689 Other abnormalities of gait and mobility: Secondary | ICD-10-CM

## 2024-05-03 DIAGNOSIS — I10 Essential (primary) hypertension: Secondary | ICD-10-CM | POA: Diagnosis not present

## 2024-05-03 DIAGNOSIS — E782 Mixed hyperlipidemia: Secondary | ICD-10-CM | POA: Diagnosis not present

## 2024-05-03 DIAGNOSIS — M25561 Pain in right knee: Secondary | ICD-10-CM | POA: Diagnosis not present

## 2024-05-03 DIAGNOSIS — G8929 Other chronic pain: Secondary | ICD-10-CM

## 2024-05-03 NOTE — Therapy (Signed)
 OUTPATIENT PHYSICAL THERAPY LOWER EXTREMITY TREATMENT   Patient Name: [name] MRN: [id number] DOB:1941/08/04, 83 y.o., female Today's Date: 05/03/2024  END OF SESSION:  PT End of Session - 05/03/24 1017     Visit Number 9    Date for PT Re-Evaluation 05/18/24    Authorization Type [employer] Medicare    PT Start Time 0932    PT Stop Time 1013    PT Time Calculation (min) 41 min    Activity Tolerance Patient tolerated treatment well    Behavior During Therapy WFL for tasks assessed/performed                 Past Medical History:  Diagnosis Date   Atrophic vaginitis    Elevated cholesterol    Fibroid    Submucous myoma   Hypertension    Osteopenia 2011   T score -1.2 FRAX 7.6%/0.8%   Past Surgical History:  Procedure Laterality Date   CESAREAN SECTION     X2   HYSTEROSCOPY     Resect myoma   TUBAL LIGATION     Patient Active Problem List   Diagnosis Date Noted   Osteopenia    Fibroid    Atrophic vaginitis    Hypertension    Elevated cholesterol     PCP: Dannielle Bouchard   REFERRING PROVIDER: Cleotilde Planas, MD  REFERRING DIAG: M17.0 (ICD-10-CM) - Primary osteoarthritis of both knees   THERAPY DIAG:  Chronic pain of left knee  Other abnormalities of gait and mobility  Chronic pain of right knee  Muscle weakness (generalized)  Rationale for Evaluation and Treatment: Rehabilitation  ONSET DATE: chronic- stopped walking in Fall 2024  SUBJECTIVE:   SUBJECTIVE STATEMENT: I have stiffness in my Lt knee for most of the day.  I am going to see Dr Heide on 05/11/24.   Eval: Pt presents to PT with bil knee pain and OA. 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  MD told her to stop walking for exercise as she might fall and break a hip.  She stopped walking in Fall 2024 and would like to get back to this.    PERTINENT HISTORY: Osteopenia T score  -1.2  PAIN: 05/03/24 Are you having pain? Yes: NPRS scale: 2/10 Pain location: Lt >Rt knees- lateral  Pain description: shooting, stiffness   Aggravating factors: walking, standing Relieving factors: ice, Advil  PRECAUTIONS: Fall  RED FLAGS: None   WEIGHT BEARING RESTRICTIONS: No  FALLS:  Has patient fallen in last 6 months? Yes. Number of falls 1- in bathroom putting on pants  LIVING ENVIRONMENT: Lives with: lives with their spouse Lives in: House/apartment Stairs: Yes: Internal: 12 steps; on right going up Has following equipment at home: Walker - 4 wheeled and Grab bars  OCCUPATION: retired   PLOF: Independent and Leisure: walking for exercise-has not been doing this due to pain  PATIENT GOALS: return to walking, strengthen legs, reduce falls risk   OBJECTIVE:  Note: Objective measures were completed at Evaluation unless otherwise noted.  DIAGNOSTIC FINDINGS:  06/2023: X-ray: bil OA in knees   PATIENT SURVEYS:  02/02/24: LEFS 45/80=56% 6 min walk test: 1151 (age related norm is 1332 feet) 03/15/24: 1207 feet   05/03/24: LEFS: 41/80   COGNITION: Overall cognitive status: Within functional limits for tasks assessed     SENSATION: WFL   MUSCLE LENGTH: Hamstring limited 25% bil  POSTURE: forward head and genu valgus Lt>Rt  PALPATION: Crepitus in bil knees, palpable tenderness over lateral knee joint  LOWER EXTREMITY ROM: Lacking 10 degrees of bil knee extension, full knee flexion   LOWER EXTREMITY MMT: Lt hip 4/5, knee 4/5, Lt hip 4+/5, knee 5/5  FUNCTIONAL TESTS:  5 times sit to stand: 11.11 with hip adduction  GAIT: Distance walked: 100 Assistive device utilized: None Level of assistance: Complete Independence Comments: genu valgus and trendelenburg                                                                                                                                TREATMENT DATE:   05/03/24: NuStep L6 x 8' Pt present to discuss  status-verbal cues for hip abduction Seated HS stretch bil 2x30  Step up on to foam pad with stabilization x10 Rt and Lt forward and lateral Sit to stand from chair with activation of hip abductors 2x10 Seated hamstring curl: yellow loop 2x10 bil  Seated hip abduction with yellow loop  Standing hip extension and abduction at barre on balance pad 2x10 bil Hurdles at barre: forward and lateral with step-to Rockerboard x3 min standing    04/19/24: NuStep L6 x 8' Pt present to discuss status-verbal cues for hip abduction Seated HS stretch bil 2x30  Step up on to foam pad with stabilization x10 Rt and Lt Sit to stand from chair with activation of hip abductors x10 Seated hamstring curl: green band 2x10 bil  Standing hip extension and abduction at barre on balance pad 2x10 bil Hurdles at barre: forward and lateral with step-to Rockerboard x3 min standing   03/21/24: NuStep L6 x 8' Pt present to discuss status-verbal cues for hip abduction Seated HS stretch bil 2x30  Step up on to foam pad with stabilization x10 Rt and Lt Sit to stand from chair with activation of hip abductors x10 Seated hamstring curl: green band 2x10 bil  Weight shifting at barre: on balance pad x1 min, 3 directions  Standing hip extension and abduction at barre on balance pad 2x10 bil Hurdles at barre: forward and lateral with step-to Rockerboard x3 min standing     PATIENT EDUCATION:  Education details: Access Code: [email or web address] Person educated: Patient Education method: Explanation, Demonstration, and Handouts Education comprehension: verbalized understanding and returned demonstration  HOME EXERCISE PROGRAM: Access Code: [email or web address] URL: [email or web address] Date: 03/01/2024 Prepared by: Burnard  Exercises - Sit to Stand with Resistance Around Legs  - 2 x daily - 7 x weekly - 2 sets - 10 reps - Seated Long Arc Quad  - 3 x daily - 7 x weekly - 2 sets - 10 reps - 5 hold - Standing Hip Abduction  with Counter Support  - 3 x daily - 7 x weekly - 2 sets - 10 reps - Seated Hamstring Stretch  - 2 x daily - 7 x weekly - 1 sets - 3 reps - 20 hold -  Supine Bridge  - 1 x daily - 7 x weekly - 2 sets - 10 reps - Side Stepping with Counter Support  - 2 x daily - 7 x weekly - 2 sets - 10 reps  ASSESSMENT:  CLINICAL IMPRESSION: Pt has been exercising at home as able.  She cares for her husband is consumed with that on most days.  She will see Dr Heide next week for assessment.   She did well with  exercises today and is working  to activate glute med to support Lt knee valgus.   PT monitored for technique and pain throughout session and she did well with all exercises without increased pain.  Patient will benefit from skilled PT to improve functional strength and endurance associated with bil knee OA, and to address the below impairments and improve overall function.   OBJECTIVE IMPAIRMENTS: Abnormal gait, decreased activity tolerance, decreased balance, decreased endurance, decreased mobility, difficulty walking, decreased ROM, decreased strength, hypomobility, increased muscle spasms, impaired flexibility, postural dysfunction, and pain.   ACTIVITY LIMITATIONS: standing, squatting, sleeping, stairs, locomotion level, and caring for others  PARTICIPATION LIMITATIONS: cleaning, laundry, shopping, and community activity  PERSONAL FACTORS: Age, Time since onset of injury/illness/exacerbation, and 1 comorbidity: bil knee OA are also affecting patient's functional outcome.   REHAB POTENTIAL: Good  CLINICAL DECISION MAKING: Stable/uncomplicated  EVALUATION COMPLEXITY: Low   GOALS: Goals reviewed with patient? Yes  SHORT TERM GOALS: Target date: 03/01/2024   Be independent in initial HEP Baseline: Goal status: MET, 5/15  2.  Return to regular walking program and verbalize safe progression  Baseline: walking around the house (03/01/24) Goal status: ONGOING DISCUSSION 5/15  3.  Report > or =  to 30% reduction in bil knee pain with standing and walking Baseline: 40% (03/08/24) Goal status: MET    LONG TERM GOALS: Target date: 05/18/24    Be independent in advanced HEP Baseline: independent in current program (03/21/24) Goal status: in progress   2.  Improve LEFS to > or = to 56/80 Baseline: 41/80 (05/03/24) Goal status: In progress   3.  Improve Lt LE to return to walking 3-4 miles with walking stick Baseline: walking inside the house (03/15/24) Goal status: in progress   4.  Improve 6 min walk test to > or = to 1300 to achieve age related norm Baseline: 1207 feet (03/15/24) Goal status: In progress   5.  Report > or = to 85% reduction in knee pain with standing and walking Baseline: 70% (03/21/24) Goal status: In progress     PLAN:  PT FREQUENCY: 1x/week  PT DURATION: 8 weeks  PLANNED INTERVENTIONS: 97110-Therapeutic exercises, 97530- Therapeutic activity, [id number]- Neuromuscular re-education, 97535- Self Care, 02859- Manual therapy, [id number]- Gait training, [phone]- Canalith repositioning, [id number]- Aquatic Therapy, [phone]- Electrical stimulation (unattended), [phone]- Electrical stimulation (manual), 97016- Vasopneumatic device, [id number]- Ionotophoresis 4mg /ml Dexamethasone, Patient/Family education, Balance training, Taping, Dry Needling, Joint mobilization, Joint manipulation, Vestibular training, Cryotherapy, and Moist heat  PLAN FOR NEXT SESSION: work on alignment and hip/knee strength, sit to stand, NuStep.  ERO next session to determine D/C vs continue.  See what MD says    Burnard Joy, PT 05/03/24 10:22 AM    [facility] [address], [address] [place], [place] [address] Phone # [phone] Fax [phone]

## 2024-05-11 DIAGNOSIS — M1712 Unilateral primary osteoarthritis, left knee: Secondary | ICD-10-CM | POA: Diagnosis not present

## 2024-05-17 ENCOUNTER — Ambulatory Visit: Attending: Family Medicine

## 2024-05-17 DIAGNOSIS — R2689 Other abnormalities of gait and mobility: Secondary | ICD-10-CM | POA: Insufficient documentation

## 2024-05-17 DIAGNOSIS — M25562 Pain in left knee: Secondary | ICD-10-CM | POA: Diagnosis not present

## 2024-05-17 DIAGNOSIS — M6281 Muscle weakness (generalized): Secondary | ICD-10-CM | POA: Insufficient documentation

## 2024-05-17 DIAGNOSIS — G8929 Other chronic pain: Secondary | ICD-10-CM | POA: Diagnosis not present

## 2024-05-17 DIAGNOSIS — M25561 Pain in right knee: Secondary | ICD-10-CM | POA: Insufficient documentation

## 2024-05-17 NOTE — Therapy (Signed)
 OUTPATIENT PHYSICAL THERAPY LOWER EXTREMITY TREATMENT   Patient Name: [name] MRN: [id number] DOB:1940/11/23, 83 y.o., female Today's Date: 05/17/2024  END OF SESSION:  PT End of Session - 05/17/24 1021     Visit Number 10    Authorization Type [employer]    PT Start Time 1017    PT Stop Time 1055    PT Time Calculation (min) 38 min    Activity Tolerance Patient tolerated treatment well    Behavior During Therapy WFL for tasks assessed/performed                  Past Medical History:  Diagnosis Date   Atrophic vaginitis    Elevated cholesterol    Fibroid    Submucous myoma   Hypertension    Osteopenia 2011   T score -1.2 FRAX 7.6%/0.8%   Past Surgical History:  Procedure Laterality Date   CESAREAN SECTION     X2   HYSTEROSCOPY     Resect myoma   TUBAL LIGATION     Patient Active Problem List   Diagnosis Date Noted   Osteopenia    Fibroid    Atrophic vaginitis    Hypertension    Elevated cholesterol     PCP: Dannielle Bouchard   REFERRING PROVIDER: Cleotilde Planas, MD  REFERRING DIAG: M17.0 (ICD-10-CM) - Primary osteoarthritis of both knees   THERAPY DIAG:  Chronic pain of left knee  Other abnormalities of gait and mobility  Chronic pain of right knee  Muscle weakness (generalized)  Rationale for Evaluation and Treatment: Rehabilitation  ONSET DATE: chronic- stopped walking in Fall 2024  SUBJECTIVE:   SUBJECTIVE STATEMENT: I saw Dr Heide and he thinks I need a knee replacement.  I got an injection and this didn't help.  He suggested maybe gel injections.    Eval: Pt presents to PT with bil knee pain and OA. 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  MD told her to stop walking for exercise as she might fall and break a hip.  She stopped walking in Fall 2024 and would like to get back to this.    PERTINENT HISTORY: Osteopenia T  score -1.2  PAIN: 05/17/24 Are you having pain? Yes: NPRS scale: 5/10 Pain location: Lt >Rt knees- lateral  Pain description: shooting, stiffness   Aggravating factors: walking, standing Relieving factors: ice, Advil  PRECAUTIONS: Fall  RED FLAGS: None   WEIGHT BEARING RESTRICTIONS: No  FALLS:  Has patient fallen in last 6 months? Yes. Number of falls 1- in bathroom putting on pants  LIVING ENVIRONMENT: Lives with: lives with their spouse Lives in: House/apartment Stairs: Yes: Internal: 12 steps; on right going up Has following equipment at home: Walker - 4 wheeled and Grab bars  OCCUPATION: retired   PLOF: Independent and Leisure: walking for exercise-has not been doing this due to pain  PATIENT GOALS: return to walking, strengthen legs, reduce falls risk   OBJECTIVE:  Note: Objective measures were completed at Evaluation unless otherwise noted.  DIAGNOSTIC FINDINGS:  06/2023: X-ray: bil OA in knees   PATIENT SURVEYS:  02/02/24: LEFS 45/80=56% 6 min walk test: 1151 (age related norm is 1332 feet) 03/15/24: 1207 feet   05/03/24: LEFS: 41/80   COGNITION: Overall cognitive status: Within functional limits for tasks assessed     SENSATION: WFL   MUSCLE LENGTH: Hamstring limited 25% bil  POSTURE: forward head and genu valgus Lt>Rt  PALPATION: Crepitus in bil knees, palpable tenderness over lateral knee joint  LOWER EXTREMITY ROM: Lacking 10 degrees of bil knee extension, full knee flexion   LOWER EXTREMITY MMT: Lt hip 4/5, knee 4/5, Lt hip 4+/5, knee 5/5  FUNCTIONAL TESTS:  5 times sit to stand: 11.11 with hip adduction  GAIT: Distance walked: 100 Assistive device utilized: None Level of assistance: Complete Independence Comments: genu valgus and trendelenburg                                                                                                                                TREATMENT DATE:   04/16/24: NuStep L6 x 8' Pt present to discuss  status-verbal cues for hip abduction Seated HS stretch bil 2x30  Step up on to foam pad with stabilization x10 Rt and Lt forward and lateral Sit to stand from chair with activation of hip abductors 2x10 Seated hamstring curl: yellow loop 2x10 bil  Seated hip abduction with yellow loop  Standing hip extension and abduction at barre on balance pad 2x10 bil Hurdles at barre: forward and lateral with step-to Rockerboard x3 min standing    05/03/24: NuStep L6 x 8' Pt present to discuss status-verbal cues for hip abduction Seated HS stretch bil 2x30  Step up on to foam pad with stabilization x10 Rt and Lt forward and lateral Sit to stand from chair with activation of hip abductors 2x10 Seated hamstring curl: yellow loop 2x10 bil  Seated hip abduction with yellow loop  Standing hip extension and abduction at barre on balance pad 2x10 bil Hurdles at barre: forward and lateral with step-to Rockerboard x3 min standing    04/19/24: NuStep L6 x 8' Pt present to discuss status-verbal cues for hip abduction Seated HS stretch bil 2x30  Step up on to foam pad with stabilization x10 Rt and Lt Sit to stand from chair with activation of hip abductors x10 Seated hamstring curl: green band 2x10 bil  Standing hip extension and abduction at barre on balance pad 2x10 bil Hurdles at barre: forward and lateral with step-to Rockerboard x3 min standing    PATIENT EDUCATION:  Education details: Access Code: [email or web address] Person educated: Patient Education method: Explanation, Demonstration, and Handouts Education comprehension: verbalized understanding and returned demonstration  HOME EXERCISE PROGRAM: Access Code: [email or web address] URL: [email or web address] Date: 03/01/2024 Prepared by: Burnard  Exercises - Sit to Stand with Resistance Around Legs  - 2 x daily - 7 x weekly - 2 sets - 10 reps - Seated Long Arc Quad  - 3 x daily - 7 x weekly - 2 sets - 10 reps - 5 hold - Standing Hip Abduction  with Counter Support  - 3 x daily - 7 x weekly - 2 sets - 10 reps - Seated Hamstring Stretch  - 2 x daily - 7 x weekly - 1 sets - 3 reps - 20 hold - Supine Bridge  -  1 x daily - 7 x weekly - 2 sets - 10 reps - Side Stepping with Counter Support  - 2 x daily - 7 x weekly - 2 sets - 10 reps  ASSESSMENT:  CLINICAL IMPRESSION: Pt saw ortho MD last week and he suggested TKA on Lt.  Pt is not able to do this surgery due to care of her husband.  Pt is going to D/C today to HEP and will resume PT in January once she gets a Stage manager.  We reviewed all HEP and discussed how this would benefit her to support her arthritis.   OBJECTIVE IMPAIRMENTS: Abnormal gait, decreased activity tolerance, decreased balance, decreased endurance, decreased mobility, difficulty walking, decreased ROM, decreased strength, hypomobility, increased muscle spasms, impaired flexibility, postural dysfunction, and pain.   ACTIVITY LIMITATIONS: standing, squatting, sleeping, stairs, locomotion level, and caring for others  PARTICIPATION LIMITATIONS: cleaning, laundry, shopping, and community activity  PERSONAL FACTORS: Age, Time since onset of injury/illness/exacerbation, and 1 comorbidity: bil knee OA are also affecting patient's functional outcome.   REHAB POTENTIAL: Good  CLINICAL DECISION MAKING: Stable/uncomplicated  EVALUATION COMPLEXITY: Low   GOALS: Goals reviewed with patient? Yes  SHORT TERM GOALS: Target date: 03/01/2024   Be independent in initial HEP Baseline: Goal status: MET, 5/15  2.  Return to regular walking program and verbalize safe progression  Baseline: walking around the house (03/01/24) Goal status: ONGOING DISCUSSION 5/15  3.  Report > or = to 30% reduction in bil knee pain with standing and walking Baseline: 40% (03/08/24) Goal status: MET    LONG TERM GOALS: Target date: 05/18/24    Be independent in advanced HEP Baseline: independent in current program (03/21/24) Goal  status: in progress   2.  Improve LEFS to > or = to 56/80 Baseline: 41/80 (05/03/24) Goal status: partially met  3.  Improve Lt LE to return to walking 3-4 miles with walking stick Baseline: walking inside the house 1x/day (05/17/24) Goal status: partially met  4.  Improve 6 min walk test to > or = to 1300 to achieve age related norm Baseline: 1207 feet  Goal status:partially met   5.  Report > or = to 85% reduction in knee pain with standing and walking Baseline: 70% (05/17/24) Goal status: partially met    PLAN: PHYSICAL THERAPY DISCHARGE SUMMARY  Visits from Start of Care: 10  Current functional level related to goals / functional outcomes: Pt will D/C to HEP today.  She will continue with HEP as able between caring for her husband.     Remaining deficits: Significant OA and alignment deficits. She will continue with HEP for strength and endurance gains.    Education / Equipment: HEP, postural strength    Patient agrees to discharge. Patient goals were partially met. Patient is being discharged due to the patient's request.    Burnard Joy, PT 05/17/24 11:02 AM    [facility] [address] [place], [place] [address] Phone # [phone] Fax [phone]

## 2024-06-03 DIAGNOSIS — I10 Essential (primary) hypertension: Secondary | ICD-10-CM | POA: Diagnosis not present

## 2024-06-03 DIAGNOSIS — E782 Mixed hyperlipidemia: Secondary | ICD-10-CM | POA: Diagnosis not present

## 2024-07-03 DIAGNOSIS — E782 Mixed hyperlipidemia: Secondary | ICD-10-CM | POA: Diagnosis not present

## 2024-07-03 DIAGNOSIS — I10 Essential (primary) hypertension: Secondary | ICD-10-CM | POA: Diagnosis not present

## 2024-08-03 DIAGNOSIS — E782 Mixed hyperlipidemia: Secondary | ICD-10-CM | POA: Diagnosis not present

## 2024-08-03 DIAGNOSIS — I10 Essential (primary) hypertension: Secondary | ICD-10-CM | POA: Diagnosis not present

## 2024-08-15 DIAGNOSIS — Z1231 Encounter for screening mammogram for malignant neoplasm of breast: Secondary | ICD-10-CM | POA: Diagnosis not present

## 2024-08-15 DIAGNOSIS — Z23 Encounter for immunization: Secondary | ICD-10-CM | POA: Diagnosis not present

## 2024-09-02 DIAGNOSIS — E782 Mixed hyperlipidemia: Secondary | ICD-10-CM | POA: Diagnosis not present

## 2024-09-02 DIAGNOSIS — I10 Essential (primary) hypertension: Secondary | ICD-10-CM | POA: Diagnosis not present
